# Patient Record
Sex: Female | Born: 1945 | Race: White | Hispanic: No | Smoking: Never smoker
Health system: Southern US, Community
[De-identification: ages and names within clinical notes are randomized; demographics above are authoritative.]

## PROBLEM LIST (undated history)

## (undated) DIAGNOSIS — E079 Disorder of thyroid, unspecified: Secondary | ICD-10-CM

## (undated) DIAGNOSIS — J45909 Unspecified asthma, uncomplicated: Secondary | ICD-10-CM

## (undated) DIAGNOSIS — M1990 Unspecified osteoarthritis, unspecified site: Secondary | ICD-10-CM

## (undated) HISTORY — DX: Unspecified asthma, uncomplicated: J45.909

## (undated) HISTORY — DX: Unspecified osteoarthritis, unspecified site: M19.90

## (undated) HISTORY — PX: REPLACEMENT TOTAL KNEE: SUR1224

## (undated) HISTORY — PX: APPENDECTOMY: SHX54

## (undated) HISTORY — DX: Disorder of thyroid, unspecified: E07.9

---

## 2020-05-06 DIAGNOSIS — Z2821 Immunization not carried out because of patient refusal: Secondary | ICD-10-CM | POA: Insufficient documentation

## 2020-05-06 DIAGNOSIS — E039 Hypothyroidism, unspecified: Secondary | ICD-10-CM | POA: Insufficient documentation

## 2020-05-06 DIAGNOSIS — F411 Generalized anxiety disorder: Secondary | ICD-10-CM | POA: Insufficient documentation

## 2020-05-06 DIAGNOSIS — Z79899 Other long term (current) drug therapy: Secondary | ICD-10-CM | POA: Insufficient documentation

## 2020-05-25 ENCOUNTER — Other Ambulatory Visit: Payer: Self-pay

## 2020-05-25 ENCOUNTER — Encounter: Payer: Self-pay | Admitting: Sports Medicine

## 2020-05-25 ENCOUNTER — Ambulatory Visit (INDEPENDENT_AMBULATORY_CARE_PROVIDER_SITE_OTHER): Payer: Federal, State, Local not specified - PPO

## 2020-05-25 ENCOUNTER — Ambulatory Visit: Payer: Federal, State, Local not specified - PPO | Admitting: Sports Medicine

## 2020-05-25 DIAGNOSIS — M25561 Pain in right knee: Secondary | ICD-10-CM | POA: Diagnosis not present

## 2020-05-25 DIAGNOSIS — Z96651 Presence of right artificial knee joint: Secondary | ICD-10-CM

## 2020-05-25 DIAGNOSIS — G8929 Other chronic pain: Secondary | ICD-10-CM

## 2020-05-25 MED ORDER — TRAMADOL HCL 50 MG PO TABS
50.0000 mg | ORAL_TABLET | Freq: Three times a day (TID) | ORAL | 0 refills | Status: DC | PRN
Start: 2020-05-25 — End: 2021-03-02

## 2020-05-25 NOTE — Progress Notes (Addendum)
    Procedures performed today:    None.  Independent interpretation of notes and tests performed by another provider:   None.  Brief History, Exam, Impression, and Recommendations:    History of total knee arthroplasty, right This is a pleasant 75 year old female, she has a history of a right total knee arthroplasty done about 10 years ago, she is having some persistent pain No fevers, chills, moderate swelling. She historically was on tramadol with good relief and good function. There is some differing opinions between her and her previous physician and so she was no longer prescribe tramadol. I did review her controlled substance database, nothing concerning was seen. I am happy to take over prescriptions of tramadol, we are going to get some x-rays of her right knee to look for evidence of loosening of the prosthesis, if negative we will consider three-phase bone scan. I should see her approximately twice a year. She also plans to establish care with one of the other providers here for primary care.  X-rays overall unremarkable, ordering three-phase bone scan.    ___________________________________________ Ihor Austin. Benjamin Stain, M.D., ABFM., CAQSM. Primary Care and Sports Medicine [facility]  Adjunct Instructor of Family Medicine  [employer]

## 2020-05-25 NOTE — Assessment & Plan Note (Addendum)
This is a pleasant 75 year old female, she has a history of a right total knee arthroplasty done about 10 years ago, she is having some persistent pain No fevers, chills, moderate swelling. She historically was on tramadol with good relief and good function. There is some differing opinions between her and her previous physician and so she was no longer prescribe tramadol. I did review her controlled substance database, nothing concerning was seen. I am happy to take over prescriptions of tramadol, we are going to get some x-rays of her right knee to look for evidence of loosening of the prosthesis, if negative we will consider three-phase bone scan. I should see her approximately twice a year. She also plans to establish care with one of the other providers here for primary care.  X-rays overall unremarkable, ordering three-phase bone scan.

## 2020-05-26 NOTE — Addendum Note (Signed)
Addended by: Monica Becton on: 05/26/2020 04:55 PM   Modules accepted: Orders

## 2020-06-01 ENCOUNTER — Encounter (HOSPITAL_COMMUNITY): Payer: Federal, State, Local not specified - PPO

## 2020-07-05 ENCOUNTER — Ambulatory Visit: Payer: Federal, State, Local not specified - PPO | Admitting: Family Medicine

## 2020-07-08 ENCOUNTER — Other Ambulatory Visit: Payer: Self-pay

## 2020-07-08 ENCOUNTER — Ambulatory Visit: Payer: Federal, State, Local not specified - PPO | Admitting: Family Medicine

## 2020-07-08 ENCOUNTER — Encounter: Payer: Self-pay | Admitting: Family Medicine

## 2020-07-08 DIAGNOSIS — E039 Hypothyroidism, unspecified: Secondary | ICD-10-CM

## 2020-07-08 DIAGNOSIS — G4700 Insomnia, unspecified: Secondary | ICD-10-CM | POA: Insufficient documentation

## 2020-07-08 DIAGNOSIS — Z96651 Presence of right artificial knee joint: Secondary | ICD-10-CM

## 2020-07-08 DIAGNOSIS — F5104 Psychophysiologic insomnia: Secondary | ICD-10-CM | POA: Diagnosis not present

## 2020-07-08 MED ORDER — LEVOTHYROXINE SODIUM 88 MCG PO TABS: ORAL_TABLET | ORAL | 1 refills | Status: DC

## 2020-07-08 MED ORDER — ALPRAZOLAM 2 MG PO TABS
1.0000 mg | ORAL_TABLET | Freq: Every evening | ORAL | 1 refills | Status: DC | PRN
Start: 2020-07-08 — End: 2020-09-10

## 2020-07-08 NOTE — Progress Notes (Signed)
Cheryl Rogers - 75 y.o. female MRN [id number]  Date of birth: 1945/10/05  Subjective Chief Complaint  Patient presents with  . Establish Care    HPI Cheryl Rogers is a 75 y.o. female here today for initial visit to establish care.  She feels like she has been in pretty good health.  She recently moved here from [place].  She does have history of hypothyroidism, chronic knee pain, and insomnia.   Knee pain is managed with tramadol and she is seeing Dr. Benjamin Stain for this.    She feels good with current dose of levothyroxine.  Labs completed in March at previous PCP.   She is taking 1-2mg  of alprazolam at bedtime.  She has been on this for several months after trying multiple other medication for sleep.  She recalls trying melatonin, valerian root, tylenol pm,  ambien, sonata, and restoril. She thinks there may be a few more she has taken as well.  She would be open to trying other options if available.   ROS:  A comprehensive ROS was completed and negative except as noted per HPI  Allergies  Allergen Reactions  . Iodine Rash    Past Medical History:  Diagnosis Date  . Arthritis   . Asthma   . Thyroid disease     Past Surgical History:  Procedure Laterality Date  . APPENDECTOMY    . REPLACEMENT TOTAL KNEE Right     Social History   Socioeconomic History  . Marital status: Divorced    Spouse name: Not on file  . Number of children: 1  . Years of education: Not on file  . Highest education level: Not on file  Occupational History  . Occupation: Retired  Tobacco Use  . Smoking status: Never Smoker  . Smokeless tobacco: Never Used  Vaping Use  . Vaping Use: Never used  Substance and Sexual Activity  . Alcohol use: Not Currently  . Drug use: Never  . Sexual activity: Not Currently    Partners: Male  Other Topics Concern  . Not on file  Social History Narrative  . Not on file   Social Determinants of Health   Financial Resource Strain: Not on file  Food  Insecurity: Not on file  Transportation Needs: Not on file  Physical Activity: Not on file  Stress: Not on file  Social Connections: Not on file    History reviewed. No pertinent family history.  Health Maintenance  Topic Date Due  . COVID-19 Vaccine (1) Never done  . Hepatitis C Screening  Never done  . TETANUS/TDAP  Never done  . COLONOSCOPY (Pts 45-62yrs Insurance coverage will need to be confirmed)  Never done  . DEXA SCAN  Never done  . PNA vac Low Risk Adult (1 of 2 - PCV13) Never done  . INFLUENZA VACCINE  09/27/2020  . HPV VACCINES  Aged Out     ----------------------------------------------------------------------------------------------------------------------------------------------------------------------------------------------------------------- Physical Exam BP (!) 128/50 (BP Location: Left Arm, Patient Position: Sitting, Cuff Size: Normal)   Pulse 72   Temp 98.2 F (36.8 C)   Ht 5\' 2"  (1.575 m)   Wt 128 lb 4.8 oz (58.2 kg)   SpO2 100%   BMI 23.47 kg/m   Physical Exam Constitutional:      Appearance: Normal appearance.  HENT:     Head: Normocephalic and atraumatic.  Eyes:     General: No scleral icterus. Cardiovascular:     Rate and Rhythm: Normal rate and regular rhythm.  Pulmonary:  Effort: Pulmonary effort is normal.     Breath sounds: Normal breath sounds.  Musculoskeletal:     Cervical back: Neck supple.  Skin:    General: Skin is warm and dry.  Neurological:     General: No focal deficit present.     Mental Status: She is alert.  Psychiatric:        Mood and Affect: Mood normal.        Behavior: Behavior normal.     ------------------------------------------------------------------------------------------------------------------------------------------------------------------------------------------------------------------- Assessment and Plan  Hypothyroidism (acquired) Previous labs reviewed through Care Everywhere.   Continue  levothyroxine at current strength.   History of total knee arthroplasty, right She is having persistent pain which she is currently seeing Dr. Benjamin Stain for.  She has upcoming bone scan for further evaluation.  Pain managed with tramadol for now.   Insomnia She has tried numerous medications to help with sleep.  Alprazolam has worked best for her.  Will continue for now but would like to wean down on strength.  Will also request records to review what she has tried previously.    Meds ordered this encounter  Medications  . alprazolam (XANAX) 2 MG tablet    Sig: Take 0.5-1 tablets (1-2 mg total) by mouth at bedtime as needed.    Dispense:  30 tablet    Refill:  1  . levothyroxine (SYNTHROID) 88 MCG tablet    Sig: TAKE 1 TABLET BY MOUTH EVERY DAY IN THE MORNING ON AN EMPTY STOMACH    Dispense:  90 tablet    Refill:  1    Return in about 3 months (around 10/08/2020) for Hypothyroid/Insomnia.    This visit occurred during the SARS-CoV-2 public health emergency.  Safety protocols were in place, including screening questions prior to the visit, additional usage of staff PPE, and extensive cleaning of exam room while observing appropriate contact time as indicated for disinfecting solutions.

## 2020-07-08 NOTE — Assessment & Plan Note (Signed)
She is having persistent pain which she is currently seeing Dr. Benjamin Stain for.  She has upcoming bone scan for further evaluation.  Pain managed with tramadol for now.

## 2020-07-08 NOTE — Assessment & Plan Note (Signed)
Previous labs reviewed through Care Everywhere.   Continue levothyroxine at current strength.

## 2020-07-08 NOTE — Assessment & Plan Note (Signed)
She has tried numerous medications to help with sleep.  Alprazolam has worked best for her.  Will continue for now but would like to wean down on strength.  Will also request records to review what she has tried previously.

## 2020-07-08 NOTE — Patient Instructions (Addendum)
Great to meet you today! Continue current medications.  See me again in 3 months.

## 2020-07-13 ENCOUNTER — Encounter: Payer: Self-pay | Admitting: Family Medicine

## 2020-07-26 ENCOUNTER — Other Ambulatory Visit: Payer: Self-pay

## 2020-07-26 ENCOUNTER — Encounter: Payer: Self-pay | Admitting: Emergency Medicine

## 2020-07-26 ENCOUNTER — Emergency Department
Admission: EM | Admit: 2020-07-26 | Discharge: 2020-07-26 | Disposition: A | Discharge status: Home / Self Care | Payer: Federal, State, Local not specified - PPO | Source: Home / Self Care | Attending: Family Medicine | Admitting: Family Medicine

## 2020-07-26 DIAGNOSIS — L247 Irritant contact dermatitis due to plants, except food: Secondary | ICD-10-CM | POA: Diagnosis not present

## 2020-07-26 MED ORDER — METHYLPREDNISOLONE ACETATE 80 MG/ML IJ SUSP
80.0000 mg | Freq: Once | INTRAMUSCULAR | Status: AC
  Administered 2020-07-26: 80 mg via INTRAMUSCULAR

## 2020-07-26 MED ORDER — DOXYCYCLINE HYCLATE 100 MG PO CAPS: ORAL_CAPSULE | ORAL | 0 refills | Status: DC

## 2020-07-26 NOTE — ED Provider Notes (Signed)
[facility]    CSN: [id number] Arrival date & time: 07/26/20  1532      History   Chief Complaint Chief Complaint  Patient presents with  . Rash    HPI Cheryl Rogers is a 75 y.o. female.   While cutting brush on a newly acquired piece of property two weeks ago, patient believes that she contacted poison oak.  She has had a persistent pruritic rash on her sun-exposed arms and upper legs (she was wearing high boots that covered her legs below the knees).  She recalls that she initially developed tiny blisters containing clear fluid.   The history is provided by the patient.  Rash Location: forearms and upper legs. Quality: blistering, itchiness and redness   Quality: not draining and not weeping   Severity:  Moderate Duration:  2 weeks Timing:  Constant Progression:  Worsening Chronicity:  New Context: plant contact   Relieved by:  Nothing Worsened by:  Heat Ineffective treatments:  Anti-itch cream Associated symptoms: no fatigue, no fever, no induration and no joint pain     Past Medical History:  Diagnosis Date  . Arthritis   . Asthma   . Thyroid disease     Patient Active Problem List   Diagnosis Date Noted  . Insomnia 07/08/2020  . History of total knee arthroplasty, right 05/25/2020  . Immunization refused 05/06/2020  . Hypothyroidism (acquired) 05/06/2020  . GAD (generalized anxiety disorder) 05/06/2020  . Controlled substance agreement signed 05/06/2020    Past Surgical History:  Procedure Laterality Date  . APPENDECTOMY    . REPLACEMENT TOTAL KNEE Right     OB History   No obstetric history on file.      Home Medications    Prior to Admission medications   Medication Sig Start Date End Date Taking? Authorizing Provider  doxycycline (VIBRAMYCIN) 100 MG capsule Take one cap PO Q12hr with food. 07/26/20  Yes Lattie Haw, MD  levothyroxine (SYNTHROID) 88 MCG tablet TAKE 1 TABLET BY MOUTH EVERY DAY IN THE MORNING ON AN EMPTY  STOMACH 07/08/20  Yes Everrett Coombe, DO  traMADol (ULTRAM) 50 MG tablet Take 1-2 tablets (50-100 mg total) by mouth every 8 (eight) hours as needed for moderate pain. Maximum 6 tabs per day. 05/25/20  Yes Monica Becton, MD  alprazolam Prudy Feeler) 2 MG tablet Take 0.5-1 tablets (1-2 mg total) by mouth at bedtime as needed. 07/08/20   Everrett Coombe, DO    Family History Family History  Problem Relation Age of Onset  . Healthy Mother   . Healthy Father     Social History Social History   Tobacco Use  . Smoking status: Never Smoker  . Smokeless tobacco: Never Used  Vaping Use  . Vaping Use: Never used  Substance Use Topics  . Alcohol use: Not Currently  . Drug use: Never     Allergies   Iodine   Review of Systems Review of Systems  Constitutional: Negative for activity change, chills, diaphoresis, fatigue and fever.  Musculoskeletal: Negative for arthralgias.  Skin: Positive for color change and rash. Negative for wound.  All other systems reviewed and are negative.    Physical Exam Triage Vital Signs ED Triage Vitals  Enc Vitals Group     BP 07/26/20 1727 (!) 145/79     Pulse Rate 07/26/20 1727 67     Resp 07/26/20 1727 17     Temp 07/26/20 1727 98.4 F (36.9 C)     Temp Source 07/26/20  1727 Oral     SpO2 07/26/20 1727 100 %     Weight 07/26/20 1729 128 lb 4.8 oz (58.2 kg)     Height 07/26/20 1729 5\' 2"  (1.575 m)     Head Circumference --      Peak Flow --      Pain Score 07/26/20 1728 4     Pain Loc --      Pain Edu? --      Excl. in GC? --    No data found.  Updated Vital Signs BP (!) 145/79 (BP Location: Right Arm)   Pulse 67   Temp 98.4 F (36.9 C) (Oral)   Resp 17   Ht 5\' 2"  (1.575 m)   Wt 58.2 kg   SpO2 100%   BMI 23.47 kg/m   Visual Acuity Right Eye Distance:   Left Eye Distance:   Bilateral Distance:    Right Eye Near:   Left Eye Near:    Bilateral Near:     Physical Exam Vitals and nursing note reviewed.  Constitutional:       General: She is not in acute distress. HENT:     Head: Normocephalic.     Mouth/Throat:     Mouth: Mucous membranes are moist.  Eyes:     Pupils: Pupils are equal, round, and reactive to light.  Cardiovascular:     Rate and Rhythm: Normal rate.  Pulmonary:     Effort: Pulmonary effort is normal.  Skin:    General: Skin is warm and dry.     Findings: Rash present.          Comments: Exposed areas of arms and upper legs have numerous small 56mm to 40mm erythematous macules, some with central eschar.  No tenderness to palpation.  Left antecubital area has confluent erythema.  Neurological:     Mental Status: She is alert and oriented to person, place, and time.      UC Treatments / Results  Labs (all labs ordered are listed, but only abnormal results are displayed) Labs Reviewed - No data to display  EKG   Radiology No results found.  Procedures Procedures (including critical care time)  Medications Ordered in UC Medications  methylPREDNISolone acetate (DEPO-MEDROL) injection 80 mg (has no administration in time range)    Initial Impression / Assessment and Plan / UC Course  I have reviewed the triage vital signs and the nursing notes.  Pertinent labs & imaging results that were available during my care of the patient were reviewed by me and considered in my medical decision making (see chart for details).    Suspect likely heat rash (miliara crystallina) vs contact dermatitis, with secondary cellulitis. Begin doxycycline. Administered Depo Medrol 80mg  IM Followup with Family Doctor if not improved in one week.    Final Clinical Impressions(s) / UC Diagnoses   Final diagnoses:  Irritant contact dermatitis due to plants, except food     Discharge Instructions     Minimize baths/showers until improved.  Use a mild bath soap containing oil such as unscented Dove.  Apply a moisturizing cream or lotion immediately after bathing while still wet, then towel dry.      ED Prescriptions    Medication Sig Dispense Auth. Provider   doxycycline (VIBRAMYCIN) 100 MG capsule Take one cap PO Q12hr with food. 14 capsule 1m, MD        4m, MD 07/27/20 [phone]

## 2020-07-26 NOTE — ED Triage Notes (Signed)
Rash x 2 weeks Pt thinks she got poison oak while clearing brush at her new house OTC - acetone, rubbing alcohol, calamine, Tecnu (off the internet)  No COVID vaccine

## 2020-07-26 NOTE — Discharge Instructions (Addendum)
Minimize baths/showers until improved.  Use a mild bath soap containing oil such as unscented Dove.  Apply a moisturizing cream or lotion immediately after bathing while still wet, then towel dry.

## 2020-07-28 ENCOUNTER — Telehealth: Payer: Self-pay | Admitting: *Deleted

## 2020-07-28 NOTE — Telephone Encounter (Signed)
Looks like she was given injection of depo-medrol.  It may take a day or two for this to start working well for her.  She can try benadryl initially and cool compresses.

## 2020-07-28 NOTE — Telephone Encounter (Signed)
Pt notified of provider recommendations. 

## 2020-07-28 NOTE — Telephone Encounter (Signed)
Pt left vm right at 5pm yesterday stating that she was in UC for poison oak and was given an antibiotic but is still having pretty bad itching.  She wanted to know if there is anything you could give her for the itching.  Please advise.

## 2020-09-10 ENCOUNTER — Ambulatory Visit: Payer: Federal, State, Local not specified - PPO | Admitting: Family Medicine

## 2020-09-10 ENCOUNTER — Encounter: Payer: Self-pay | Admitting: Family Medicine

## 2020-09-10 ENCOUNTER — Other Ambulatory Visit: Payer: Self-pay

## 2020-09-10 VITALS — BP 152/67 | HR 78 | Ht 62.0 in | Wt 125.0 lb

## 2020-09-10 DIAGNOSIS — E039 Hypothyroidism, unspecified: Secondary | ICD-10-CM | POA: Diagnosis not present

## 2020-09-10 DIAGNOSIS — F5104 Psychophysiologic insomnia: Secondary | ICD-10-CM | POA: Diagnosis not present

## 2020-09-10 MED ORDER — ALPRAZOLAM 2 MG PO TABS: 1.0000 mg | ORAL_TABLET | Freq: Every evening | ORAL | 1 refills | Status: DC | PRN

## 2020-09-11 LAB — TSH: TSH: 2.16 mIU/L (ref 0.40–4.50)

## 2020-09-11 NOTE — Assessment & Plan Note (Signed)
She continues to do well with alprazolam 1 to 2 mg at bedtime as needed.  I will continue these for now however discussed with her that ideally I would like to wean her back to a lower dose of this.

## 2020-09-11 NOTE — Assessment & Plan Note (Signed)
She feels good at current dose of levothyroxine.  We will update TSH today.

## 2020-09-11 NOTE — Progress Notes (Signed)
Cheryl Rogers - 75 y.o. female MRN [id number]  Date of birth: [birth year]  Subjective Chief Complaint  Patient presents with   Allergic Rhinitis     HPI Cheryl Rogers is a 75 year old female here today for a follow-up visit.  She has follow-up today for hypothyroidism and insomnia.  She states she is doing well however has had some issues with allergies recently.  This was worse after mowing her grass yesterday.  She is taking antihistamines and decongestants to help with this.  Her blood pressure is elevated today however she denies any symptoms related to this.  Her previous TSH was around 12 but her other PCPs office.  She reports she has been taking levothyroxine daily.  She has not noted any symptoms related to hypo or hyperthyroidism.  She continues to take alprazolam 1 to 2 mg at bedtime as needed for sleep.  This continues to work well for her without significant side effects.  She does not use this every night but does use most nights.  She has tried multiple medications in the past which are reflected in her previous medical records as well as my last note.  ROS:  A comprehensive ROS was completed and negative except as noted per HPI  Allergies  Allergen Reactions   Iodine Rash    Past Medical History:  Diagnosis Date   Arthritis    Asthma    Thyroid disease     Past Surgical History:  Procedure Laterality Date   APPENDECTOMY     REPLACEMENT TOTAL KNEE Right     Social History   Socioeconomic History   Marital status: Divorced    Spouse name: Not on file   Number of children: 1   Years of education: Not on file   Highest education level: Not on file  Occupational History   Occupation: Retired  Tobacco Use   Smoking status: Never   Smokeless tobacco: Never  Vaping Use   Vaping Use: Never used  Substance and Sexual Activity   Alcohol use: Not Currently   Drug use: Never   Sexual activity: Not Currently    Partners: Male  Other Topics Concern   Not on file   Social History Narrative   Not on file   Social Determinants of Health   Financial Resource Strain: Not on file  Food Insecurity: Not on file  Transportation Needs: Not on file  Physical Activity: Not on file  Stress: Not on file  Social Connections: Not on file    Family History  Problem Relation Age of Onset   Healthy Mother    Healthy Father     Health Maintenance  Topic Date Due   COVID-19 Vaccine (1) Never done   Hepatitis C Screening  Never done   TETANUS/TDAP  Never done   COLONOSCOPY (Pts 45-77yrs Insurance coverage will need to be confirmed)  Never done   DEXA SCAN  Never done   PNA vac Low Risk Adult (1 of 2 - PCV13) Never done   Zoster Vaccines- Shingrix (2 of 2) 04/24/2012   INFLUENZA VACCINE  09/27/2020   HPV VACCINES  Aged Out     ----------------------------------------------------------------------------------------------------------------------------------------------------------------------------------------------------------------- Physical Exam BP (!) 152/67 (BP Location: Left Arm, Patient Position: Sitting, Cuff Size: Normal)   Pulse 78   Ht 5\' 2"  (1.575 m)   Wt 125 lb (56.7 kg)   SpO2 98%   BMI 22.86 kg/m   Physical Exam Constitutional:      Appearance: Normal appearance.  Eyes:  General: No scleral icterus. Cardiovascular:     Rate and Rhythm: Normal rate and regular rhythm.  Pulmonary:     Effort: Pulmonary effort is normal.     Breath sounds: Normal breath sounds.  Musculoskeletal:     Cervical back: Neck supple.  Neurological:     General: No focal deficit present.     Mental Status: She is alert.  Psychiatric:        Mood and Affect: Mood normal.        Behavior: Behavior normal.    ------------------------------------------------------------------------------------------------------------------------------------------------------------------------------------------------------------------- Assessment and  Plan  Insomnia She continues to do well with alprazolam 1 to 2 mg at bedtime as needed.  I will continue these for now however discussed with her that ideally I would like to wean her back to a lower dose of this.  Hypothyroidism (acquired) She feels good at current dose of levothyroxine.  We will update TSH today.   Meds ordered this encounter  Medications   alprazolam (XANAX) 2 MG tablet    Sig: Take 0.5-1 tablets (1-2 mg total) by mouth at bedtime as needed.    Dispense:  90 tablet    Refill:  1    Return in about 6 months (around 03/13/2021) for insomnia/hypothyroidism.    This visit occurred during the SARS-CoV-2 public health emergency.  Safety protocols were in place, including screening questions prior to the visit, additional usage of staff PPE, and extensive cleaning of exam room while observing appropriate contact time as indicated for disinfecting solutions.

## 2020-09-15 ENCOUNTER — Ambulatory Visit: Payer: Federal, State, Local not specified - PPO | Admitting: Family Medicine

## 2020-10-07 ENCOUNTER — Ambulatory Visit: Payer: Federal, State, Local not specified - PPO | Admitting: Family Medicine

## 2020-10-26 ENCOUNTER — Other Ambulatory Visit: Payer: Self-pay

## 2020-10-26 ENCOUNTER — Ambulatory Visit (INDEPENDENT_AMBULATORY_CARE_PROVIDER_SITE_OTHER): Payer: Federal, State, Local not specified - PPO

## 2020-10-26 ENCOUNTER — Ambulatory Visit: Payer: Federal, State, Local not specified - PPO | Admitting: Sports Medicine

## 2020-10-26 DIAGNOSIS — M545 Low back pain, unspecified: Secondary | ICD-10-CM | POA: Diagnosis not present

## 2020-10-26 DIAGNOSIS — M25551 Pain in right hip: Secondary | ICD-10-CM | POA: Diagnosis not present

## 2020-10-26 DIAGNOSIS — G8929 Other chronic pain: Secondary | ICD-10-CM

## 2020-10-26 MED ORDER — MELOXICAM 15 MG PO TABS: ORAL_TABLET | ORAL | 3 refills | Status: DC

## 2020-10-26 NOTE — Progress Notes (Addendum)
    Procedures performed today:    None.  Independent interpretation of notes and tests performed by another provider:   None.  Brief History, Exam, Impression, and Recommendations:    Chronic right hip pain Cheryl Rogers is a very pleasant 75 year old female, she has pain in her right proximal lateral hip, worse with twisting motions, she also has significant back pain. On exam she has no tenderness over the greater trochanter, she has no pain with internal rotation, negative FADIR sign. No pain to palpation over the lumbar spine. I suspect her hip pain is referred from her lumbar spine, we discussed core conditioning. I am going to add some lumbar spine and hip conditioning exercises, meloxicam, x-rays of the lumbar spine and the hip. Return to see me in 4 to 6 weeks, advanced imaging if no better.    ___________________________________________ Ihor Austin. Benjamin Stain, M.D., ABFM., CAQSM. Primary Care and Sports Medicine [facility]  Adjunct Instructor of Family Medicine  [employer]

## 2020-10-26 NOTE — Assessment & Plan Note (Addendum)
Cheryl Rogers is a very pleasant 75 year old female, she has pain in her right proximal lateral hip, worse with twisting motions, she also has significant back pain. On exam she has no tenderness over the greater trochanter, she has no pain with internal rotation, negative FADIR sign. No pain to palpation over the lumbar spine. I suspect her hip pain is referred from her lumbar spine, we discussed core conditioning. I am going to add some lumbar spine and hip conditioning exercises, meloxicam, x-rays of the lumbar spine and the hip. Return to see me in 4 to 6 weeks, advanced imaging if no better.

## 2020-11-26 ENCOUNTER — Ambulatory Visit: Payer: Federal, State, Local not specified - PPO | Admitting: Sports Medicine

## 2021-01-31 ENCOUNTER — Other Ambulatory Visit: Payer: Self-pay | Admitting: Family Medicine

## 2021-02-02 ENCOUNTER — Other Ambulatory Visit: Payer: Self-pay

## 2021-02-02 ENCOUNTER — Ambulatory Visit (INDEPENDENT_AMBULATORY_CARE_PROVIDER_SITE_OTHER): Payer: Federal, State, Local not specified - PPO

## 2021-02-02 ENCOUNTER — Ambulatory Visit: Payer: Federal, State, Local not specified - PPO | Admitting: Sports Medicine

## 2021-02-02 DIAGNOSIS — Z09 Encounter for follow-up examination after completed treatment for conditions other than malignant neoplasm: Secondary | ICD-10-CM

## 2021-02-02 DIAGNOSIS — M1712 Unilateral primary osteoarthritis, left knee: Secondary | ICD-10-CM

## 2021-02-02 DIAGNOSIS — G8929 Other chronic pain: Secondary | ICD-10-CM | POA: Insufficient documentation

## 2021-02-02 MED ORDER — CELECOXIB 200 MG PO CAPS: ORAL_CAPSULE | ORAL | 2 refills | Status: DC

## 2021-02-02 NOTE — Progress Notes (Signed)
    Procedures performed today:    None.  Independent interpretation of notes and tests performed by another provider:   None.  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 She is also post right knee arthroplasty. She is starting to have pain in the left knee, posterior/medial joint line, moderate gelling, unable to flex past 90 degrees. We will start conservatively, Celebrex, x-rays, formal physical therapy, return to see me in 4 weeks, injection if no better.   ___________________________________________ Ihor Austin. Benjamin Stain, M.D., ABFM., CAQSM. Primary Care and Sports Medicine [facility]  Adjunct Instructor of Family Medicine  [employer]

## 2021-02-02 NOTE — Assessment & Plan Note (Signed)
This is a pleasant

## 2021-02-02 NOTE — Progress Notes (Signed)
j

## 2021-02-02 NOTE — Assessment & Plan Note (Signed)
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 She is also post right knee arthroplasty. She is starting to have pain in the left knee, posterior/medial joint line, moderate gelling, unable to flex past 90 degrees. We will start conservatively, Celebrex, x-rays, formal physical therapy, return to see me in 4 weeks, injection if no better.

## 2021-02-09 ENCOUNTER — Encounter: Payer: Self-pay | Admitting: Physical Therapy

## 2021-02-09 ENCOUNTER — Ambulatory Visit (INDEPENDENT_AMBULATORY_CARE_PROVIDER_SITE_OTHER): Payer: Federal, State, Local not specified - PPO | Admitting: Physical Therapy

## 2021-02-09 ENCOUNTER — Other Ambulatory Visit: Payer: Self-pay

## 2021-02-09 DIAGNOSIS — R29898 Other symptoms and signs involving the musculoskeletal system: Secondary | ICD-10-CM

## 2021-02-09 DIAGNOSIS — R262 Difficulty in walking, not elsewhere classified: Secondary | ICD-10-CM | POA: Diagnosis not present

## 2021-02-09 DIAGNOSIS — M25562 Pain in left knee: Secondary | ICD-10-CM | POA: Diagnosis not present

## 2021-02-09 NOTE — Patient Instructions (Signed)
Access Code: [email or web address] URL: [email or web address] Date: 02/09/2021 Prepared by: [employer]  Exercises Supine Quad Set - 1 x daily - 7 x weekly - 1 sets - 10 reps - 3-5 seconds hold Small Range Straight Leg Raise - 1 x daily - 7 x weekly - 3 sets - 10 reps Straight Leg Raise with External Rotation - 1 x daily - 7 x weekly - 3 sets - 10 reps Sidelying Hip Abduction - 1 x daily - 7 x weekly - 3 sets - 10 reps Prone Hip Extension - 1 x daily - 7 x weekly - 3 sets - 10 reps  Patient Education [place] Patient Instructions

## 2021-02-09 NOTE — Therapy (Signed)
[facility] [place] [address] [place], [place], [address] Phone: [phone]   Fax:  [phone]  Physical Therapy Evaluation  Patient Details  Name: Cheryl Rogers MRN: [id number] Date of Birth: [birth year] Referring Provider (PT): Thekkekandam   Encounter Date: 02/09/2021   PT End of Session - 02/09/21 1342     Visit Number 1    Number of Visits 12    Date for PT Re-Evaluation 03/23/21    PT Start Time 1100    PT Stop Time 1145    PT Time Calculation (min) 45 min    Activity Tolerance Patient tolerated treatment well    Behavior During Therapy [facility] for tasks assessed/performed             Past Medical History:  Diagnosis Date   Arthritis    Asthma    Thyroid disease     Past Surgical History:  Procedure Laterality Date   APPENDECTOMY     REPLACEMENT TOTAL KNEE Right     There were no vitals filed for this visit.    Subjective Assessment - 02/09/21 1107     Subjective Pt states she has 2 acres of land she has been mowing and working on and has  had recent onset of Lt knee pain in the past 3 months. Pt states pain increases with knee flexion so she has difficulty standing from low surfaces and putting on pants. Pain decreases with seated rest. Pain occasionally travels up the back of her thigh in a deep achey pain. She saw MD who diagonsed her with arthritis and bone spurs and recommends PT.    Pertinent History Rt knee replacement    How long can you walk comfortably? 30-40 minutes    Diagnostic tests x ray shows Lt knee OA    Patient Stated Goals decrease pain and be able to work in yard without pain    Currently in Pain? Yes    Pain Score 6     Pain Location Knee    Pain Orientation Left;Posterior;Lateral    Pain Descriptors / Indicators Aching;Sharp    Pain Type Acute pain    Pain Onset More than a month ago    Pain Frequency Intermittent    Aggravating Factors  knee flexion    Pain Relieving  Factors seated rest                [facility] PT Assessment - 02/09/21 0001       Assessment   Medical Diagnosis Lt knee OA    Referring Provider (PT) Thekkekandam    Onset Date/Surgical Date 11/25/20    Next MD Visit 4 weeks    Prior Therapy after Rt knee replacement      Precautions   Precautions None      Restrictions   Weight Bearing Restrictions No      Balance Screen   Has the patient fallen in the past 6 months No      Home Environment   Additional Comments 2 steps to enter level home      Prior Function   Level of Independence Independent      Observation/Other Assessments   Focus on Therapeutic Outcomes (FOTO)  56      Observation/Other Assessments-Edema    Edema Circumferential      Circumferential Edema   Circumferential - Right 37cm    Circumferential - Left  37.5 cm      ROM / Strength   AROM / PROM /  Strength AROM;Strength      AROM   AROM Assessment Site Knee    Right/Left Knee Left;Right    Right Knee Extension 0    Right Knee Flexion 121    Left Knee Extension -4    Left Knee Flexion 115   pain     Strength   Overall Strength Comments Lt hip ext 4-/5, Lt hip abd 4-/5 (pain), Lt hip flex 4+/5   pain in medial Lt knee with resisted hip abduction   Strength Assessment Site Knee    Right/Left Knee Right;Left    Right Knee Flexion 4+/5    Right Knee Extension 4+/5    Left Knee Flexion 4+/5    Left Knee Extension 4+/5      Palpation   Palpation comment TTP pes anserine area, hamstring insertion posterior Lt knee                        Objective measurements completed on examination: See above findings.       [facility] Adult PT Treatment/Exercise - 02/09/21 0001       Exercises   Exercises Knee/Hip      Knee/Hip Exercises: Supine   Quad Sets 5 reps    Straight Leg Raises 5 reps    Straight Leg Raise with External Rotation 5 reps      Knee/Hip Exercises: Sidelying   Hip ABduction 5 reps      Knee/Hip Exercises:  Prone   Hip Extension 5 reps      Modalities   Modalities Iontophoresis      Iontophoresis   Type of Iontophoresis Dexamethasone    Location Lt knee    Dose 45ml    Time 8 hour patch                     PT Education - 02/09/21 1138     Education Details PT POC and goals, HEP, ionto    Person(s) Educated Patient    Methods Explanation;Demonstration;Handout    Comprehension Returned demonstration;Verbalized understanding                 PT Long Term Goals - 02/09/21 1551       PT LONG TERM GOAL #1   Title Pt will be independent with HEP    Time 6    Period Weeks    Status New    Target Date 03/23/21      PT LONG TERM GOAL #2   Title Pt will improve FOTO to >=70 to demo improved functional mobility    Time 6    Period Weeks    Status New    Target Date 03/23/21      PT LONG TERM GOAL #3   Title Pt will tolerate standing up from toilet seat with pain <= 1/10    Time 6    Period Weeks    Status New    Target Date 03/23/21      PT LONG TERM GOAL #4   Title Pt will climb onto stool and/or ladder with pain <= 2/10    Time 6    Period Weeks    Status New    Target Date 03/23/21                    Plan - 02/09/21 1343     Clinical Impression Statement Pt is a 75 y/o female referred for Lt knee OA. Pt presents with  symptoms consistent with Lt pes anserine bursitis with tenderness in Lt medial and posterior knee, pain with resisted hip abduction, decreased Lt hamstring strength and pain with Lt knee flexion. Pt will benefit from skilled PT to address deficits and improve functional mobility    Personal Factors and Comorbidities Comorbidity 2;Past/Current Experience;Behavior Pattern    Examination-Activity Limitations Transfers;Bend;Locomotion Level    Examination-Participation Restrictions Community Activity;Yard Work;Cleaning    Stability/Clinical Decision Making Evolving/Moderate complexity    Clinical Decision Making Moderate    Rehab  Potential Good    PT Frequency 2x / week    PT Duration 6 weeks    PT Treatment/Interventions Aquatic Therapy;Electrical Stimulation;Iontophoresis 4mg /ml Dexamethasone;Cryotherapy;Moist Heat;Stair training;Gait training;Therapeutic exercise;Balance training;Neuromuscular re-education;Patient/family education;Therapeutic activities;Manual techniques;Passive range of motion;Dry needling;Vasopneumatic Device;Taping    PT Next Visit Plan assess HEP, progress knee strength and ROM    PT Home Exercise Plan [phone]    Consulted and Agree with Plan of Care Patient             Patient will benefit from skilled therapeutic intervention in order to improve the following deficits and impairments:  Pain, Decreased strength, Decreased range of motion, Decreased activity tolerance, Decreased mobility, Difficulty walking, Increased muscle spasms, Impaired flexibility  Visit Diagnosis: Acute pain of left knee - Plan: PT plan of care cert/re-cert  Difficulty in walking, not elsewhere classified - Plan: PT plan of care cert/re-cert  Other symptoms and signs involving the musculoskeletal system - Plan: PT plan of care cert/re-cert     Problem List Patient Active Problem List   Diagnosis Date Noted   Primary osteoarthritis of left knee 02/02/2021   Chronic right hip pain 10/26/2020   Insomnia 07/08/2020   History of total knee arthroplasty, right 05/25/2020   Immunization refused 05/06/2020   Hypothyroidism (acquired) 05/06/2020   GAD (generalized anxiety disorder) 05/06/2020   Controlled substance agreement signed 05/06/2020    07/06/2020, PT 02/09/2021, 3:59 PM  [facility] [address] [place], [place], [place] Phone: [phone]   Fax:  [phone]  Name: Cheryl Rogers MRN: [name] Date of Birth: November 24, 1945

## 2021-02-15 ENCOUNTER — Encounter: Payer: Self-pay | Admitting: Physical Therapy

## 2021-02-15 ENCOUNTER — Other Ambulatory Visit: Payer: Self-pay

## 2021-02-15 ENCOUNTER — Ambulatory Visit: Payer: Federal, State, Local not specified - PPO | Admitting: Physical Therapy

## 2021-02-15 DIAGNOSIS — M25562 Pain in left knee: Secondary | ICD-10-CM

## 2021-02-15 DIAGNOSIS — R262 Difficulty in walking, not elsewhere classified: Secondary | ICD-10-CM

## 2021-02-15 DIAGNOSIS — R29898 Other symptoms and signs involving the musculoskeletal system: Secondary | ICD-10-CM | POA: Diagnosis not present

## 2021-02-15 NOTE — Therapy (Signed)
[facility] [place] [address] [place], [place], [address] Phone: [phone]   Fax:  [phone]  Physical Therapy Treatment  Patient Details  Name: Cheryl Rogers MRN: [id number] Date of Birth: [birth year] Referring Provider (PT): Thekkekandam   Encounter Date: 02/15/2021   PT End of Session - 02/15/21 1155     Visit Number 2    Number of Visits 12    Date for PT Re-Evaluation 03/23/21    PT Start Time 1151    PT Stop Time 1230    PT Time Calculation (min) 39 min    Activity Tolerance Patient tolerated treatment well    Behavior During Therapy [facility] for tasks assessed/performed             Past Medical History:  Diagnosis Date   Arthritis    Asthma    Thyroid disease     Past Surgical History:  Procedure Laterality Date   APPENDECTOMY     REPLACEMENT TOTAL KNEE Right     There were no vitals filed for this visit.   Subjective Assessment - 02/15/21 1155     Subjective Pt reports relief with ionto patch to Lt knee.  She reports improved walking tolerance at store, with less pain.    Patient Stated Goals decrease pain and be able to work in yard without pain    Currently in Pain? Yes    Pain Score 1     Pain Location Knee    Pain Orientation Left;Lateral    Pain Descriptors / Indicators Aching;Radiating    Aggravating Factors  lifting knee up to don pants    Pain Relieving Factors seated rest; recliner.                [facility] PT Assessment - 02/15/21 0001       Assessment   Medical Diagnosis Lt knee OA    Referring Provider (PT) Thekkekandam    Onset Date/Surgical Date 11/25/20    Prior Therapy after Rt knee replacement      Flexibility   Soft Tissue Assessment /Muscle Length yes    Quadriceps ~90 bilat              [facility] Adult PT Treatment/Exercise - 02/15/21 0001       Self-Care   Self-Care Other Self-Care Comments    Other Self-Care Comments  pt educated on self massage with  roller stick to LLE. pt returned demo with cues.      Knee/Hip Exercises: Stretches   Passive Hamstring Stretch Left;2 reps;20 seconds   supine with strap   Quad Stretch Left;2 reps;30 seconds;Right;1 rep   prone with strap   ITB Stretch Left;2 reps;20 seconds   supine with strap   Gastroc Stretch Right;Left;2 reps;20 seconds    Other Knee/Hip Stretches supine Lt adductor stretch with strap x 2 reps of 20 sec      Knee/Hip Exercises: Aerobic   Nustep L5: 5 min for warm up (arms/legs)      Knee/Hip Exercises: Seated   Sit to Sand without UE support   squat to touch table with buttocks x 2 reps     Knee/Hip Exercises: Supine   Quad Sets Left;1 set;5 reps   5 sec hold   Straight Leg Raises Strengthening;Left;1 set;10 reps    Straight Leg Raise with External Rotation Strengthening;Left;1 set;10 reps      Knee/Hip Exercises: Sidelying   Hip ABduction Strengthening;Left;2 sets;15 reps    Hip ABduction Limitations added pulses  on 2nd set      Knee/Hip Exercises: Prone   Hip Extension Left;1 set;10 reps   (knee flexed)   Straight Leg Raises Strengthening;Left;1 set;10 reps      Iontophoresis   Type of Iontophoresis Dexamethasone    Location Lt pes anserine    Dose 1.cc    Time 8 hr patch                PT Long Term Goals - 02/09/21 1551       PT LONG TERM GOAL #1   Title Pt will be independent with HEP    Time 6    Period Weeks    Status New    Target Date 03/23/21      PT LONG TERM GOAL #2   Title Pt will improve FOTO to >=70 to demo improved functional mobility    Time 6    Period Weeks    Status New    Target Date 03/23/21      PT LONG TERM GOAL #3   Title Pt will tolerate standing up from toilet seat with pain <= 1/10    Time 6    Period Weeks    Status New    Target Date 03/23/21      PT LONG TERM GOAL #4   Title Pt will climb onto stool and/or ladder with pain <= 2/10    Time 6    Period Weeks    Status New    Target Date 03/23/21                    Plan - 02/15/21 1248     Clinical Impression Statement Positive response to ionto patch and exercises, with pt reporting less pain overall.  Tight quads bilat; added stretch to HEP, along with adductor and ITB stretches.  Pt tolerated exercises well, without increase in pain.  Goals are ongoing.    Personal Factors and Comorbidities Comorbidity 2;Past/Current Experience;Behavior Pattern    Examination-Activity Limitations Transfers;Bend;Locomotion Level    Examination-Participation Restrictions Community Activity;Yard Work;Cleaning    Stability/Clinical Decision Making Evolving/Moderate complexity    Rehab Potential Good    PT Frequency 2x / week    PT Duration 6 weeks    PT Treatment/Interventions Aquatic Therapy;Electrical Stimulation;Iontophoresis 4mg /ml Dexamethasone;Cryotherapy;Moist Heat;Stair training;Gait training;Therapeutic exercise;Balance training;Neuromuscular re-education;Patient/family education;Therapeutic activities;Manual techniques;Passive range of motion;Dry needling;Vasopneumatic Device;Taping    PT Next Visit Plan progress knee strength and ROM    PT Home Exercise Plan    Consulted and Agree with Plan of Care Patient             Patient will benefit from skilled therapeutic intervention in order to improve the following deficits and impairments:  Pain, Decreased strength, Decreased range of motion, Decreased activity tolerance, Decreased mobility, Difficulty walking, Increased muscle spasms, Impaired flexibility  Visit Diagnosis: Acute pain of left knee  Difficulty in walking, not elsewhere classified  Other symptoms and signs involving the musculoskeletal system     Problem List Patient Active Problem List   Diagnosis Date Noted   Primary osteoarthritis of left knee 02/02/2021   Chronic right hip pain 10/26/2020   Insomnia 07/08/2020   History of total knee arthroplasty, right 05/25/2020   Immunization refused 05/06/2020    Hypothyroidism (acquired) 05/06/2020   GAD (generalized anxiety disorder) 05/06/2020   Controlled substance agreement signed 05/06/2020   07/06/2020, PTA 02/15/21 12:58 PM  [facility] [address]  [place], [place], [address] Phone: [phone]   Fax:  [phone]  Name: Cheryl Rogers MRN: [id number] Date of Birth: [birth year]

## 2021-02-24 ENCOUNTER — Ambulatory Visit: Payer: Federal, State, Local not specified - PPO | Admitting: Physical Therapy

## 2021-02-24 ENCOUNTER — Encounter: Payer: Self-pay | Admitting: Physical Therapy

## 2021-02-24 ENCOUNTER — Other Ambulatory Visit: Payer: Self-pay

## 2021-02-24 DIAGNOSIS — R29898 Other symptoms and signs involving the musculoskeletal system: Secondary | ICD-10-CM | POA: Diagnosis not present

## 2021-02-24 DIAGNOSIS — M25562 Pain in left knee: Secondary | ICD-10-CM

## 2021-02-24 DIAGNOSIS — R262 Difficulty in walking, not elsewhere classified: Secondary | ICD-10-CM | POA: Diagnosis not present

## 2021-02-24 NOTE — Patient Instructions (Addendum)
Kinesiology tape What is kinesiology tape?  There are many brands of kinesiology tape.  KTape, [employer], [employer], Dynamic tape, to name a few. It is an elasticized tape designed to support the bodys natural healing process. This tape provides stability and support to muscles and joints without restricting motion. It can also help decrease swelling in the area of application. How does it work? The tape microscopically lifts and decompresses the skin to allow for drainage of lymph (swelling) to flow away from area, reducing inflammation.  The tape has the ability to help re-educate the neuromuscular system by targeting specific receptors in the skin.  The presence of the tape increases the bodys awareness of posture and body mechanics.  Do not use with: Open wounds Skin lesions Adhesive allergies Safe removal of the tape: In some rare cases, mild/moderate skin irritation can occur.  This can include redness, itchiness, or hives. If this occurs, immediately remove tape and consult your primary care physician if symptoms are severe or do not resolve within 2 days.  To remove tape safely, hold nearby skin with one hand and gentle roll tape down with other hand.  You can apply oil or conditioner to tape while in shower prior to removal to loosen adhesive.  DO NOT swiftly rip tape off like a band-aid, as this could cause skin tears and additional skin irritation.   Access Code: [email or web address] URL: [email or web address] Date: 02/24/2021 Prepared by: [facility] [place]  Exercises Single Leg Balance with Clock Reach - 1 x daily - 7 x weekly - 1 sets - 10 reps Supine Bridge - 1 x daily - 7 x weekly - 1-2 sets - 10 reps - 5 hold Sidelying Hip Abduction - 1 x daily - 7 x weekly - 2 sets - 10 reps Supine ITB Stretch with Strap - 1-2 x daily - 7 x weekly - 1 sets - 2 reps - 20 seconds hold Hip Adductors and Hamstring Stretch with Strap - 1-2 x daily - 7 x weekly - 1 sets - 2  reps - 20 seconds hold Prone Quadriceps Stretch with Strap - 1-2 x daily - 7 x weekly - 1 sets - 2 reps - 20 seconds hold

## 2021-02-24 NOTE — Therapy (Signed)
[place], [place], [address] Phone: [phone]   Fax:  [phone]  Physical Therapy Treatment  Patient Details  Name: Cheryl Rogers MRN: [id number] Date of Birth: 1945-10-04 Referring Provider (PT): Dianah Field   Encounter Date: 02/24/2021   PT End of Session - 02/24/21 1537     Visit Number 3    Number of Visits 12    Date for PT Re-Evaluation 03/23/21    PT Start Time 3953    PT Stop Time 1617    PT Time Calculation (min) 44 min    Activity Tolerance Patient tolerated treatment well    Behavior During Therapy [facility] for tasks assessed/performed             Past Medical History:  Diagnosis Date   Arthritis    Asthma    Thyroid disease     Past Surgical History:  Procedure Laterality Date   APPENDECTOMY     REPLACEMENT TOTAL KNEE Right     There were no vitals filed for this visit.   Subjective Assessment - 02/24/21 1535     Subjective Pt reports that her movement with LLE is much better and she is able to don/doff clothes easier.  She feels like "something is in knee".  She is using robe tie for stretching and found a roller.    Patient Stated Goals decrease pain and be able to work in yard without pain    Currently in Pain? No/denies    Pain Score 0-No pain                [facility] PT Assessment - 02/24/21 0001       Assessment   Medical Diagnosis Lt knee OA    Referring Provider (PT) Thekkekandam    Onset Date/Surgical Date 11/25/20    Prior Therapy after Rt knee replacement               [facility] Adult PT Treatment/Exercise - 02/24/21 0001       Knee/Hip Exercises: Stretches   Passive Hamstring Stretch Left;Right;2 reps;20 seconds    Quad Stretch Left;2 reps;30 seconds;Right;1 rep   prone with strap   ITB Stretch Left;20 seconds;1 rep   supine with strap   Other Knee/Hip Stretches supine Lt adductor stretch with strap x 2 reps of 20 sec      Knee/Hip  Exercises: Aerobic   Nustep L5: 5 min for warm up (arms/legs)      Knee/Hip Exercises: Standing   Forward Step Up Left;2 sets;5 reps;Right;1 set;Hand Hold: 2   12" step   SLS Lt/Rt SLS with toe taps front, side, back x 8 each without UE support.    Other Standing Knee Exercises resisted single leg clam with green band; single leg bridge pushing off of wall x 5 each leg.      Knee/Hip Exercises: Supine   Bridges Strengthening;1 set;20 reps      Knee/Hip Exercises: Sidelying   Hip ABduction Strengthening;Left;1 set;10 reps;Right   with 5 pulses per rep                         PT Long Term Goals - 02/24/21 1541       PT LONG TERM GOAL #1   Title Pt will be independent with HEP    Time 6    Period Weeks    Status On-going    Target Date 03/23/21  PT LONG TERM GOAL #2   Title Pt will improve FOTO to >=70 to demo improved functional mobility    Time 6    Period Weeks    Status On-going    Target Date 03/23/21      PT LONG TERM GOAL #3   Title Pt will tolerate standing up from toilet seat with pain <= 1/10    Time 6    Period Weeks    Status Achieved    Target Date 03/23/21      PT LONG TERM GOAL #4   Title Pt will climb onto stool and/or ladder with pain <= 2/10    Time 6    Period Weeks    Status On-going    Target Date 03/23/21                   Plan - 02/24/21 1546     Clinical Impression Statement Pt making good gains with reported reduction in pain in Lt knee.  Bilat quads remain tight.  Some cramping in Lt hamstring with bridges..  Trial of Ktape applied at distal ITB to increase proprioception and decompress tissue. Updated HEP. Pt has met LTG #3 and making good progress towards remaining goals.    Personal Factors and Comorbidities Comorbidity 2;Past/Current Experience;Behavior Pattern    Examination-Activity Limitations Transfers;Bend;Locomotion Level    Examination-Participation Restrictions Community Activity;Yard Work;Cleaning     Stability/Clinical Decision Making Evolving/Moderate complexity    Rehab Potential Good    PT Frequency 2x / week    PT Duration 6 weeks    PT Treatment/Interventions Aquatic Therapy;Electrical Stimulation;Iontophoresis 54m/ml Dexamethasone;Cryotherapy;Moist Heat;Stair training;Gait training;Therapeutic exercise;Balance training;Neuromuscular re-education;Patient/family education;Therapeutic activities;Manual techniques;Passive range of motion;Dry needling;Vasopneumatic Device;Taping    PT Next Visit Plan progress knee strength and ROM; assess response to tape.    PT Home Exercise Plan [phone]   Consulted and Agree with Plan of Care Patient             Patient will benefit from skilled therapeutic intervention in order to improve the following deficits and impairments:  Pain, Decreased strength, Decreased range of motion, Decreased activity tolerance, Decreased mobility, Difficulty walking, Increased muscle spasms, Impaired flexibility  Visit Diagnosis: Acute pain of left knee  Difficulty in walking, not elsewhere classified  Other symptoms and signs involving the musculoskeletal system     Problem List Patient Active Problem List   Diagnosis Date Noted   Primary osteoarthritis of left knee 02/02/2021   Chronic right hip pain 10/26/2020   Insomnia 07/08/2020   History of total knee arthroplasty, right 05/25/2020   Immunization refused 05/06/2020   Hypothyroidism (acquired) 05/06/2020   GAD (generalized anxiety disorder) 05/06/2020   Controlled substance agreement signed 05/06/2020   JKerin Perna PTA 02/24/21 6:00 PM  [place] [address]Phone: [phone]  Fax:  [phone] Name: Cheryl MarengoMRN: [id number] of Birth: 706/18/1947

## 2021-03-02 ENCOUNTER — Other Ambulatory Visit: Payer: Self-pay

## 2021-03-02 ENCOUNTER — Ambulatory Visit: Payer: Federal, State, Local not specified - PPO | Admitting: Sports Medicine

## 2021-03-02 DIAGNOSIS — Z96651 Presence of right artificial knee joint: Secondary | ICD-10-CM

## 2021-03-02 DIAGNOSIS — M1712 Unilateral primary osteoarthritis, left knee: Secondary | ICD-10-CM

## 2021-03-02 MED ORDER — TRAMADOL HCL 50 MG PO TABS: 50.0000 mg | ORAL_TABLET | Freq: Three times a day (TID) | ORAL | 0 refills | Status: DC | PRN

## 2021-03-02 NOTE — Assessment & Plan Note (Signed)
X-ray confirmed osteoarthritis, has done really well with Celebrex, physical therapy, almost no pain. She would like a refill of Ultram, happy to do this, return as needed.

## 2021-03-02 NOTE — Progress Notes (Signed)
° ° °  Procedures performed today:    None.  Independent interpretation of notes and tests performed by another provider:   None.  Brief History, Exam, Impression, and Recommendations:    Primary osteoarthritis of left knee X-ray confirmed osteoarthritis, has done really well with Celebrex, physical therapy, almost no pain. She would like a refill of Ultram, happy to do this, return as needed.    ___________________________________________ Ihor Austin. Benjamin Stain, M.D., ABFM., CAQSM. Primary Care and Sports Medicine [facility]  Adjunct Instructor of Family Medicine  [employer]

## 2021-03-03 ENCOUNTER — Encounter: Payer: Federal, State, Local not specified - PPO | Admitting: Physical Therapy

## 2021-03-10 ENCOUNTER — Other Ambulatory Visit: Payer: Self-pay

## 2021-03-10 ENCOUNTER — Encounter: Payer: Self-pay | Admitting: Physical Therapy

## 2021-03-10 ENCOUNTER — Ambulatory Visit: Payer: Federal, State, Local not specified - PPO | Attending: Sports Medicine | Admitting: Physical Therapy

## 2021-03-10 DIAGNOSIS — M25562 Pain in left knee: Secondary | ICD-10-CM

## 2021-03-10 DIAGNOSIS — R29898 Other symptoms and signs involving the musculoskeletal system: Secondary | ICD-10-CM

## 2021-03-10 DIAGNOSIS — M1712 Unilateral primary osteoarthritis, left knee: Secondary | ICD-10-CM | POA: Insufficient documentation

## 2021-03-10 DIAGNOSIS — R262 Difficulty in walking, not elsewhere classified: Secondary | ICD-10-CM

## 2021-03-10 NOTE — Patient Instructions (Signed)

## 2021-03-10 NOTE — Therapy (Signed)
[facility] [place] [address] [place], [place], [address] Phone: [phone]   Fax:  [phone]  Physical Therapy Treatment  Patient Details  Name: Cheryl Rogers MRN: [id number] Date of Birth: 1946-01-22 Referring Provider (PT): Thekkekandam   Encounter Date: 03/10/2021   PT End of Session - 03/10/21 1509     Visit Number 4    Number of Visits 12    Date for PT Re-Evaluation 03/23/21    PT Start Time 1509    PT Stop Time 1557    PT Time Calculation (min) 48 min    Activity Tolerance Patient tolerated treatment well    Behavior During Therapy [facility] for tasks assessed/performed             Past Medical History:  Diagnosis Date   Arthritis    Asthma    Thyroid disease     Past Surgical History:  Procedure Laterality Date   APPENDECTOMY     REPLACEMENT TOTAL KNEE Right     There were no vitals filed for this visit.   Subjective Assessment - 03/10/21 1509     Subjective Pt reports she has no pain with walking, but has some "zinging" with bending Lt knee in certain ways.  She reports her pain is better than it was when she first started PT.    Pertinent History Rt knee replacement    Diagnostic tests x ray shows Lt knee OA    Patient Stated Goals decrease pain and be able to work in yard without pain    Currently in Pain? No/denies    Pain Score 0-No pain                [facility] PT Assessment - 03/10/21 0001       Assessment   Medical Diagnosis Lt knee OA    Referring Provider (PT) Thekkekandam    Onset Date/Surgical Date 11/25/20    Next MD Visit PRN    Prior Therapy after Rt knee replacement      Flexibility   Quadriceps ~95 bilat with prone stretch.               [facility] Adult PT Treatment/Exercise - 03/10/21 0001       Knee/Hip Exercises: Stretches   Passive Hamstring Stretch Right;30 seconds;2 reps;Left;3 reps   standing with leg on 12" step   Quad Stretch Left;2 reps;30  seconds;Right;1 rep   prone with strap     Knee/Hip Exercises: Aerobic   Nustep L5: 5 min for warm up (arms/legs)      Knee/Hip Exercises: Standing   Forward Step Up Limitations 5 reps onto 12" step with BUE on rails    SLS R/L SLS forward leans to touch chair seat x 8 each      Knee/Hip Exercises: Seated   Stool Scoot - Round Trips 10 ft      Manual Therapy   Manual Therapy Soft tissue mobilization;Taping    Soft tissue mobilization IASTM (not tolerated- petechiae instantly); STM to prox Lt gastroc/soleus and Lt biceps femoris to decrease fsacial restrictions and improve mobility.    Kinesiotex IT consultant I strip of reg rock tape applied with 25% stretch to Lt distal biceps femoris and perpendicular strip over area of most restriction.  - to decompress tissue and increase proprioception.  PT Education - 03/10/21 1707     Education Details DN info    Person(s) Educated Patient    Methods Explanation    Comprehension Verbalized understanding                 PT Long Term Goals - 03/10/21 1708       PT LONG TERM GOAL #1   Title Pt will be independent with HEP    Time 6    Period Weeks    Status On-going    Target Date 03/23/21      PT LONG TERM GOAL #2   Title Pt will improve FOTO to >=70 to demo improved functional mobility    Time 6    Period Weeks    Status On-going    Target Date 03/23/21      PT LONG TERM GOAL #3   Title Pt will tolerate standing up from toilet seat with pain <= 1/10    Time 6    Period Weeks    Status Achieved    Target Date 03/23/21      PT LONG TERM GOAL #4   Title Pt will climb onto stool and/or ladder with pain <= 2/10    Time 6    Period Weeks    Status Achieved    Target Date 03/23/21                   Plan - 03/10/21 1516     Clinical Impression Statement Pt had increased pain in posterior lateral Lt thigh/knee with flexion during NuStep, and with  stool scoots.  Lt biceps femoris tight and banded at distal portion; tender with STM.  Pt may benefit from DN/ manual therapy to area at next visit.  Progressing well towards remaining goals.    Personal Factors and Comorbidities Comorbidity 2;Past/Current Experience;Behavior Pattern    Examination-Activity Limitations Transfers;Bend;Locomotion Level    Examination-Participation Restrictions Community Activity;Yard Work;Cleaning    Stability/Clinical Decision Making Evolving/Moderate complexity    Rehab Potential Good    PT Frequency 2x / week    PT Duration 6 weeks    PT Treatment/Interventions Aquatic Therapy;Electrical Stimulation;Iontophoresis 4mg /ml Dexamethasone;Cryotherapy;Moist Heat;Stair training;Gait training;Therapeutic exercise;Balance training;Neuromuscular re-education;Patient/family education;Therapeutic activities;Manual techniques;Passive range of motion;Dry needling;Vasopneumatic Device;Taping    PT Next Visit Plan progress knee strength and ROM; assess response to tape.    PT Home Exercise Plan [phone]    Consulted and Agree with Plan of Care Patient             Patient will benefit from skilled therapeutic intervention in order to improve the following deficits and impairments:  Pain, Decreased strength, Decreased range of motion, Decreased activity tolerance, Decreased mobility, Difficulty walking, Increased muscle spasms, Impaired flexibility  Visit Diagnosis: Acute pain of left knee  Difficulty in walking, not elsewhere classified  Other symptoms and signs involving the musculoskeletal system     Problem List Patient Active Problem List   Diagnosis Date Noted   Primary osteoarthritis of left knee 02/02/2021   Chronic right hip pain 10/26/2020   Insomnia 07/08/2020   History of total knee arthroplasty, right 05/25/2020   Immunization refused 05/06/2020   Hypothyroidism (acquired) 05/06/2020   GAD (generalized anxiety disorder) 05/06/2020   Controlled  substance agreement signed 05/06/2020    07/06/2020 03/10/2021, 5:12 PM  [facility] [address] [place], [place], [place] Phone: [phone]   Fax:  [phone]  Name: Cheryl Rogers MRN: [name]  Date of Birth: [birth year]

## 2021-03-14 ENCOUNTER — Ambulatory Visit: Payer: Federal, State, Local not specified - PPO | Admitting: Family Medicine

## 2021-03-14 ENCOUNTER — Other Ambulatory Visit: Payer: Self-pay

## 2021-03-14 ENCOUNTER — Encounter: Payer: Self-pay | Admitting: Physical Therapy

## 2021-03-14 ENCOUNTER — Ambulatory Visit: Payer: Federal, State, Local not specified - PPO | Admitting: Physical Therapy

## 2021-03-14 DIAGNOSIS — R29898 Other symptoms and signs involving the musculoskeletal system: Secondary | ICD-10-CM

## 2021-03-14 DIAGNOSIS — M25562 Pain in left knee: Secondary | ICD-10-CM

## 2021-03-14 DIAGNOSIS — R262 Difficulty in walking, not elsewhere classified: Secondary | ICD-10-CM

## 2021-03-14 DIAGNOSIS — M1712 Unilateral primary osteoarthritis, left knee: Secondary | ICD-10-CM | POA: Diagnosis not present

## 2021-03-14 NOTE — Therapy (Signed)
[place], [place], [address] Phone: [phone]   Fax:  [phone]  Physical Therapy Treatment  Patient Details  Name: Cheryl Rogers MRN: [id number] Date of Birth: 1945/11/05 Referring Provider (PT): Thekkekandam   Encounter Date: 03/14/2021   PT End of Session - 03/14/21 1416     Visit Number 5    Number of Visits 12    Date for PT Re-Evaluation 03/23/21    PT Start Time [id number]    PT Stop Time 1503    PT Time Calculation (min) 48 min    Activity Tolerance Patient tolerated treatment well    Behavior During Therapy [facility] for tasks assessed/performed             Past Medical History:  Diagnosis Date   Arthritis    Asthma    Thyroid disease     Past Surgical History:  Procedure Laterality Date   APPENDECTOMY     REPLACEMENT TOTAL KNEE Right     There were no vitals filed for this visit.   Subjective Assessment - 03/14/21 1417     Subjective My leg feels so much better after last visit. Feels with hip and knee flexion.    Pertinent History Rt knee replacement    Patient Stated Goals decrease pain and be able to work in yard without pain    Currently in Pain? Yes    Pain Score 4     Pain Location Knee    Pain Orientation Left;Lateral    Pain Descriptors / Indicators [facility] Adult PT Treatment/Exercise - 03/14/21 0001       Knee/Hip Exercises: Stretches   Passive Hamstring Stretch Right;30 seconds;2 reps;Left;3 reps   standing with leg on 12" step   Passive Hamstring Stretch Limitations also in long sitting    Hip Flexor Stretch 1 rep;30 seconds;2 reps;Left;Right    Hip Flexor Stretch Limitations supine off EOB with strap (2 on left side); also attempted childs pose postion for stretch and 1/2 kneel and warrior stretch    ITB Stretch 1 rep;Left;20 seconds    ITB Stretch Limitations SDLY off EOB; no significant stretch  reported      Knee/Hip Exercises: Aerobic   Nustep L5: 6 min for warm up (arms/legs)      Knee/Hip Exercises: Standing   Forward Step Up Limitations 15 reps onto 12" step with BUE on rails      Knee/Hip Exercises: Prone   Hamstring Curl 10 reps    Hamstring Curl Limitations eccentric lowering with 4# wt      Manual Therapy   Manual Therapy Soft tissue mobilization    Manual therapy comments skilled palpation and monitoring of soft tissues during DN    Soft tissue mobilization to left lateral quad/HS distally, left lateral gastroc              Trigger Point Dry Needling - 03/14/21 0001     Consent Given? Yes    Education Handout Provided Previously provided    Muscles Treated Lower Quadrant Vastus lateralis;Popliteus;Gastrocnemius;Hamstring    Dry Needling Comments left    Vastus lateralis Response Palpable increased muscle length    Popliteus Response Palpable increased muscle length    Hamstring Response Twitch response elicited   biceps femoris   Gastrocnemius Response Twitch  response elicited   lateral                  PT Education - 03/14/21 1510     Education Details supine HF/quad stretch with strap    Person(s) Educated Patient    Methods Explanation;Demonstration;Handout    Comprehension Verbalized understanding;Returned demonstration                 PT Long Term Goals - 03/10/21 1708       PT LONG TERM GOAL #1   Title Pt will be independent with HEP    Time 6    Period Weeks    Status On-going    Target Date 03/23/21      PT LONG TERM GOAL #2   Title Pt will improve FOTO to >=70 to demo improved functional mobility    Time 6    Period Weeks    Status On-going    Target Date 03/23/21      PT LONG TERM GOAL #3   Title Pt will tolerate standing up from toilet seat with pain <= 1/10    Time 6    Period Weeks    Status Achieved    Target Date 03/23/21      PT LONG TERM GOAL #4   Title Pt will climb onto stool and/or ladder with  pain <= 2/10    Time 6    Period Weeks    Status Achieved    Target Date 03/23/21                   Plan - 03/14/21 1511     Clinical Impression Statement Patient reporting significant improvement after IASTM last visit but still feeling twinge of pain with left hip/knee flexion. Initial trial of DN went very well with ++ twitch response in lateral gastroc head and biceps femoris. Cheryl Rogers was able to flex her hip/knee with increased mobility immediately following manual therapy. She continues to progress with LTGs.    PT Frequency 2x / week    PT Duration 6 weeks    PT Treatment/Interventions Aquatic Therapy;Electrical Stimulation;Iontophoresis 4mg /ml Dexamethasone;Cryotherapy;Moist Heat;Stair training;Gait training;Therapeutic exercise;Balance training;Neuromuscular re-education;Patient/family education;Therapeutic activities;Manual techniques;Passive range of motion;Dry needling;Vasopneumatic Device;Taping    PT Next Visit Plan Assess DN, continue with IASTM and DN as indicated; progress knee strength and ROM; assess response to tape.    PT Home Exercise Plan [phone]    Consulted and Agree with Plan of Care Patient             Patient will benefit from skilled therapeutic intervention in order to improve the following deficits and impairments:  Pain, Decreased strength, Decreased range of motion, Decreased activity tolerance, Decreased mobility, Difficulty walking, Increased muscle spasms, Impaired flexibility  Visit Diagnosis: Acute pain of left knee  Difficulty in walking, not elsewhere classified  Other symptoms and signs involving the musculoskeletal system     Problem List Patient Active Problem List   Diagnosis Date Noted   Primary osteoarthritis of left knee 02/02/2021   Chronic right hip pain 10/26/2020   Insomnia 07/08/2020   History of total knee arthroplasty, right 05/25/2020   Immunization refused 05/06/2020   Hypothyroidism (acquired) 05/06/2020    GAD (generalized anxiety disorder) 05/06/2020   Controlled substance agreement signed 05/06/2020    Madelyn Flavors, PT 03/14/2021, 3:14 PM  [place], [place], [address] Phone: [phone]   Fax:  [phone]  Name: Cheryl Rogers  MRN: [id number] Date of Birth: [birth year]

## 2021-03-14 NOTE — Patient Instructions (Signed)
Access Code: [email or web address] URL: [email or web address] Date: 03/14/2021 Prepared by: Raynelle Fanning  Exercises Single Leg Balance with Clock Reach - 1 x daily - 7 x weekly - 1 sets - 10 reps Supine Bridge - 1 x daily - 7 x weekly - 1-2 sets - 10 reps - 5 hold Sidelying Hip Abduction - 1 x daily - 7 x weekly - 2 sets - 10 reps Supine ITB Stretch with Strap - 1-2 x daily - 7 x weekly - 1 sets - 2 reps - 20 seconds hold Hip Adductors and Hamstring Stretch with Strap - 1-2 x daily - 7 x weekly - 1 sets - 2 reps - 20 seconds hold Prone Quadriceps Stretch with Strap - 1-2 x daily - 7 x weekly - 1 sets - 2 reps - 20 seconds hold Supine Quadriceps Stretch with Strap on Table - 2 x daily - 7 x weekly - 1 sets - 3 reps - 30-60 sec hold

## 2021-03-16 ENCOUNTER — Ambulatory Visit: Payer: Federal, State, Local not specified - PPO | Admitting: Family Medicine

## 2021-03-16 ENCOUNTER — Encounter: Payer: Self-pay | Admitting: Family Medicine

## 2021-03-16 ENCOUNTER — Other Ambulatory Visit: Payer: Self-pay

## 2021-03-16 VITALS — BP 139/53 | HR 83 | Temp 98.4°F | Ht 62.0 in | Wt 133.0 lb

## 2021-03-16 DIAGNOSIS — F5104 Psychophysiologic insomnia: Secondary | ICD-10-CM

## 2021-03-16 DIAGNOSIS — J069 Acute upper respiratory infection, unspecified: Secondary | ICD-10-CM

## 2021-03-16 DIAGNOSIS — E039 Hypothyroidism, unspecified: Secondary | ICD-10-CM

## 2021-03-16 MED ORDER — CEFTRIAXONE SODIUM 1 G IJ SOLR: 1.0000 g | Freq: Once | INTRAMUSCULAR | Status: DC

## 2021-03-16 NOTE — Progress Notes (Signed)
Cheryl Rogers - 76 y.o. female MRN [id number]  Date of birth: [birth year]  Subjective Chief Complaint  Patient presents with   Insomnia    HPI Cheryl Rogers a 76 year old female here today for follow-up visit.  She reports she is doing well at this time.  She continues on levothyroxine for management of hypothyroidism, feels good at current dose.  She would like to Discontinue alprazolam for management of her insomnia.  She has tried several medications in the past for management of her insomnia.  She is looking into trying a [employer] device to help with insomnia.    ROS:  A comprehensive ROS was completed and negative except as noted per HPI  Allergies  Allergen Reactions   Iodine Rash    Past Medical History:  Diagnosis Date   Arthritis    Asthma    Thyroid disease     Past Surgical History:  Procedure Laterality Date   APPENDECTOMY     REPLACEMENT TOTAL KNEE Right     Social History   Socioeconomic History   Marital status: Divorced    Spouse name: Not on file   Number of children: 1   Years of education: Not on file   Highest education level: Not on file  Occupational History   Occupation: Retired  Tobacco Use   Smoking status: Never   Smokeless tobacco: Never  Vaping Use   Vaping Use: Never used  Substance and Sexual Activity   Alcohol use: Not Currently   Drug use: Never   Sexual activity: Not Currently    Partners: Male  Other Topics Concern   Not on file  Social History Narrative   Not on file   Social Determinants of Health   Financial Resource Strain: Not on file  Food Insecurity: Not on file  Transportation Needs: Not on file  Physical Activity: Not on file  Stress: Not on file  Social Connections: Not on file    Family History  Problem Relation Age of Onset   Healthy Mother    Healthy Father     Health Maintenance  Topic Date Due   Hepatitis C Screening  Never done   COVID-19 Vaccine (1) 04/01/2021 (Originally 03/23/1946)    INFLUENZA VACCINE  05/27/2021 (Originally 09/27/2020)   Zoster Vaccines- Shingrix (2 of 2) 06/14/2021 (Originally 04/24/2012)   Pneumonia Vaccine 83+ Years old (1 - PCV) 03/16/2022 (Originally 09/21/1951)   DEXA SCAN  03/16/2022 (Originally 09/20/2009)   TETANUS/TDAP  03/16/2022 (Originally 09/21/1963)   HPV VACCINES  Aged Out     ----------------------------------------------------------------------------------------------------------------------------------------------------------------------------------------------------------------- Physical Exam BP (!) 139/53 (BP Location: Left Arm, Patient Position: Sitting, Cuff Size: Normal)    Pulse 83    Temp 98.4 F (36.9 C)    Ht 5\' 2"  (1.575 m)    Wt 133 lb (60.3 kg)    SpO2 98%    BMI 24.33 kg/m   Physical Exam Constitutional:      Appearance: Normal appearance.  Eyes:     General: No scleral icterus. Cardiovascular:     Rate and Rhythm: Normal rate and regular rhythm.  Pulmonary:     Effort: Pulmonary effort is normal.     Breath sounds: Normal breath sounds.  Musculoskeletal:     Cervical back: Neck supple.  Neurological:     Mental Status: She is alert.  Psychiatric:        Mood and Affect: Mood normal.        Behavior: Behavior normal.    ------------------------------------------------------------------------------------------------------------------------------------------------------------------------------------------------------------------- Assessment and  Plan  Hypothyroidism (acquired) Feels good at current dose of levothyroxine.  We will continue at current strength.  Recheck TSH at follow-up in 6 months.  Insomnia Goal is to get off of alprazolam.  She is going to try a [employer] device to see if this is beneficial for her insomnia.  I think this is reasonable to try.   Meds ordered this encounter  Medications   DISCONTD: cefTRIAXone (ROCEPHIN) injection 1 g    Return in about 6 months (around 09/13/2021) for  Thyroid/Insomnia.    This visit occurred during the SARS-CoV-2 public health emergency.  Safety protocols were in place, including screening questions prior to the visit, additional usage of staff PPE, and extensive cleaning of exam room while observing appropriate contact time as indicated for disinfecting solutions.

## 2021-03-16 NOTE — Assessment & Plan Note (Signed)
Feels good at current dose of levothyroxine.  We will continue at current strength.  Recheck TSH at follow-up in 6 months.

## 2021-03-16 NOTE — Assessment & Plan Note (Signed)
Goal is to get off of alprazolam.  She is going to try a [employer] device to see if this is beneficial for her insomnia.  I think this is reasonable to try.

## 2021-03-17 NOTE — Telephone Encounter (Signed)
Updated.  Incorrect diagnosis and medication entered on wrong patient by CMA.

## 2021-03-21 ENCOUNTER — Ambulatory Visit: Payer: Federal, State, Local not specified - PPO | Admitting: Rehabilitative and Restorative Service Providers"

## 2021-03-21 ENCOUNTER — Other Ambulatory Visit: Payer: Self-pay

## 2021-03-21 ENCOUNTER — Encounter: Payer: Self-pay | Admitting: Rehabilitative and Restorative Service Providers"

## 2021-03-21 DIAGNOSIS — M25562 Pain in left knee: Secondary | ICD-10-CM

## 2021-03-21 DIAGNOSIS — R262 Difficulty in walking, not elsewhere classified: Secondary | ICD-10-CM

## 2021-03-21 DIAGNOSIS — R29898 Other symptoms and signs involving the musculoskeletal system: Secondary | ICD-10-CM

## 2021-03-21 DIAGNOSIS — M1712 Unilateral primary osteoarthritis, left knee: Secondary | ICD-10-CM | POA: Diagnosis not present

## 2021-03-21 NOTE — Therapy (Addendum)
[facility], [place], [address] Phone: [phone]   Fax:  [phone]  Physical Therapy Treatment and Discharge Summary  .PHYSICAL THERAPY DISCHARGE SUMMARY  Visits from Start of Care: 6  Current functional level related to goals / functional outcomes: See last progress note for discharge status   Remaining deficits: Unknown    Education / Equipment: HEP   Patient agrees to discharge. Patient goals were partially met. Patient is being discharged due to being pleased with the current functional level.  Veena Sturgess P. Helene Kelp PT, MPH 04/07/21 1:02 PM  Patient Details  Name: Cheryl Rogers MRN: [id number] Date of Birth: [birth year] Referring Provider (PT): Dianah Field   Encounter Date: 03/21/2021   PT End of Session - 03/21/21 1612     Visit Number 6    Number of Visits 12    Date for PT Re-Evaluation 03/23/21    PT Start Time 1600    PT Stop Time 1645    PT Time Calculation (min) 45 min    Activity Tolerance Treatment limited secondary to medical complications (Comment)             Past Medical History:  Diagnosis Date   Arthritis    Asthma    Thyroid disease     Past Surgical History:  Procedure Laterality Date   APPENDECTOMY     REPLACEMENT TOTAL KNEE Right     There were no vitals filed for this visit.   Subjective Assessment - 03/21/21 1612     Subjective Patient reports the needling and IASTM and taping helped. Much better in the past two weeks. Wants to try the exercises this time and hold on the other. States that sometimes the leg will hurt with certain movements ane other times no problem. (She had pain with IR of hip in sitting dropping knee in.) Has come a log way. Was out walking a couple of hours today and she did OK which is much better than she was doing with walking.    Currently in Pain? No/denies    Pain Score 0-No pain    Pain Location Knee    Pain Orientation  Left;Lateral                [facility] PT Assessment - 03/21/21 0001       Assessment   Medical Diagnosis Lt knee OA    Referring Provider (PT) Thekkekandam    Onset Date/Surgical Date 11/25/20    Next MD Visit PRN    Prior Therapy after Rt knee replacement      AROM   Right Knee Extension 0    Right Knee Flexion 121    Left Knee Extension -1    Left Knee Flexion 118                           [facility] Adult PT Treatment/Exercise - 03/21/21 0001       Knee/Hip Exercises: Stretches   Passive Hamstring Stretch Left;2 reps;30 seconds    Passive Hamstring Stretch Limitations supine    ITB Stretch Left;2 reps;30 seconds    ITB Stretch Limitations supine focus on knee straight    Piriformis Stretch Left;3 reps;Right;2 reps;30 seconds    Piriformis Stretch Limitations supine travell      Knee/Hip Exercises: Aerobic   Nustep L5: 6 min for warm up (legs)      Knee/Hip Exercises: Standing   Terminal Knee Extension Strengthening;Left;10 reps;Theraband  Theraband Level (Terminal Knee Extension) Level 4 (Blue)    Terminal Knee Extension Limitations repeated with ball press into wall 10 reps x 10 sec hold    Lateral Step Up Left;Right;10 reps;Step Height: 6";Hand Hold: 1    Lateral Step Up Limitations heel tap without full wt shift    Forward Step Up Limitations 15 reps onto 12" step with BUE on rails    SLS Rt/Lt x 15 sed x 3 side    SLS with Vectors Rt/Lt SLS withforward reach to touch chair x 5 each    Gait Training forward walking slow steps x 25 ft x 4; step kick x 25 ft x 3 reps; backwards walking x 25 ft x 4 reps      Knee/Hip Exercises: Supine   Quad Sets Strengthening;Left;10 reps    Quad Sets Limitations 10 sec hold PT providing resistance posterior knee to facilitate quad contraction foot rested on bolster    Short Arc [employer] Strengthening;Left;10 reps    Short Arc Quad Sets Limitations 10 sec hold                     PT Education -  03/21/21 1647     Education Details HEP    Person(s) Educated Patient    Methods Explanation;Demonstration;Tactile cues;Verbal cues;Handout    Comprehension Verbalized understanding;Returned demonstration;Verbal cues required;Tactile cues required                 PT Long Term Goals - 03/10/21 1708       PT LONG TERM GOAL #1   Title Pt will be independent with HEP    Time 6    Period Weeks    Status On-going    Target Date 03/23/21      PT LONG TERM GOAL #2   Title Pt will improve FOTO to >=70 to demo improved functional mobility    Time 6    Period Weeks    Status On-going    Target Date 03/23/21      PT LONG TERM GOAL #3   Title Pt will tolerate standing up from toilet seat with pain <= 1/10    Time 6    Period Weeks    Status Achieved    Target Date 03/23/21      PT LONG TERM GOAL #4   Title Pt will climb onto stool and/or ladder with pain <= 2/10    Time 6    Period Weeks    Status Achieved    Target Date 03/23/21                   Plan - 03/21/21 1644     Clinical Impression Statement Good improvement in the past two weeks but pt declined additional manual wokr or DN today - wants to work on exercise and see how it goes. Still tight in Lt knee extension - improved with exercise and passive stretch assist with PT. Added HEP for terminal knee extension. Progressing well toward stated goals of therapy.    Rehab Potential Good    PT Frequency 2x / week    PT Duration 6 weeks    PT Treatment/Interventions Aquatic Therapy;Electrical Stimulation;Iontophoresis 55m/ml Dexamethasone;Cryotherapy;Moist Heat;Stair training;Gait training;Therapeutic exercise;Balance training;Neuromuscular re-education;Patient/family education;Therapeutic activities;Manual techniques;Passive range of motion;Dry needling;Vasopneumatic Device;Taping    PT Next Visit Plan Continue with strengtheninf focus on TKE; continued stretching, strengthening and balance activities  - further  IASTM, DN, manual work, taping as indicated  PT Home Exercise Plan [phone]    Consulted and Agree with Plan of Care Patient             Patient will benefit from skilled therapeutic intervention in order to improve the following deficits and impairments:     Visit Diagnosis: Acute pain of left knee  Difficulty in walking, not elsewhere classified  Other symptoms and signs involving the musculoskeletal system     Problem List Patient Active Problem List   Diagnosis Date Noted   Primary osteoarthritis of left knee 02/02/2021   Chronic right hip pain 10/26/2020   Insomnia 07/08/2020   History of total knee arthroplasty, right 05/25/2020   Immunization refused 05/06/2020   Hypothyroidism (acquired) 05/06/2020   GAD (generalized anxiety disorder) 05/06/2020   Controlled substance agreement signed 05/06/2020    Anjuli Gemmill Nilda Simmer, PT, MPH  03/21/2021, 4:59 PM  [facility] [place], [place], [address] Phone: [phone]   Fax:  [phone]  Name: Cheryl Rogers MRN: [id number] Date of Birth: 28-Jan-1946

## 2021-03-21 NOTE — Patient Instructions (Signed)
Access Code: [email or web address] URL: [email or web address] Date: 03/21/2021 Prepared by: Corlis Leak  Exercises Single Leg Balance with Clock Reach - 1 x daily - 7 x weekly - 1 sets - 10 reps Supine Bridge - 1 x daily - 7 x weekly - 1-2 sets - 10 reps - 5 hold Sidelying Hip Abduction - 1 x daily - 7 x weekly - 2 sets - 10 reps Supine ITB Stretch with Strap - 1-2 x daily - 7 x weekly - 1 sets - 2 reps - 20 seconds hold Hip Adductors and Hamstring Stretch with Strap - 1-2 x daily - 7 x weekly - 1 sets - 2 reps - 20 seconds hold Prone Quadriceps Stretch with Strap - 1-2 x daily - 7 x weekly - 1 sets - 2 reps - 20 seconds hold Supine Quadriceps Stretch with Strap on Table - 2 x daily - 7 x weekly - 1 sets - 3 reps - 30-60 sec hold Supine Quad Set - 1 x daily - 7 x weekly - 1 sets - 10 reps - 3 sec hold Supine Knee Extension Strengthening - 1 x daily - 7 x weekly - 1-2 sets - 10 reps - 5 sec hold Standing Terminal Knee Extension with Resistance - 1 x daily - 7 x weekly - 2-3 sets - 10 reps - 5-10 sec hold Standing Terminal Knee Extension at Wall with Ball - 1 x daily - 7 x weekly - 1-2 sets - 10 reps - 5-10 sec hold

## 2021-03-28 ENCOUNTER — Ambulatory Visit: Payer: Federal, State, Local not specified - PPO | Admitting: Rehabilitative and Restorative Service Providers"

## 2021-04-05 ENCOUNTER — Encounter: Payer: Federal, State, Local not specified - PPO | Admitting: Rehabilitative and Restorative Service Providers"

## 2021-04-15 ENCOUNTER — Encounter: Payer: Self-pay | Admitting: Family Medicine

## 2021-04-17 MED ORDER — ALPRAZOLAM 2 MG PO TABS: 1.0000 mg | ORAL_TABLET | Freq: Every evening | ORAL | 0 refills | Status: DC | PRN

## 2021-05-03 ENCOUNTER — Other Ambulatory Visit: Payer: Self-pay | Admitting: Family Medicine

## 2021-07-15 ENCOUNTER — Other Ambulatory Visit: Payer: Self-pay | Admitting: Family Medicine

## 2021-08-01 ENCOUNTER — Other Ambulatory Visit: Payer: Self-pay | Admitting: Family Medicine

## 2021-09-13 ENCOUNTER — Ambulatory Visit: Payer: Federal, State, Local not specified - PPO | Admitting: Family Medicine

## 2021-09-13 ENCOUNTER — Encounter: Payer: Self-pay | Admitting: Family Medicine

## 2021-09-13 VITALS — BP 145/71 | HR 60 | Ht 62.0 in | Wt 138.0 lb

## 2021-09-13 DIAGNOSIS — M1712 Unilateral primary osteoarthritis, left knee: Secondary | ICD-10-CM | POA: Diagnosis not present

## 2021-09-13 DIAGNOSIS — F5104 Psychophysiologic insomnia: Secondary | ICD-10-CM | POA: Diagnosis not present

## 2021-09-13 DIAGNOSIS — Z96651 Presence of right artificial knee joint: Secondary | ICD-10-CM | POA: Diagnosis not present

## 2021-09-13 DIAGNOSIS — E039 Hypothyroidism, unspecified: Secondary | ICD-10-CM | POA: Diagnosis not present

## 2021-09-13 DIAGNOSIS — G8929 Other chronic pain: Secondary | ICD-10-CM

## 2021-09-13 DIAGNOSIS — M25551 Pain in right hip: Secondary | ICD-10-CM

## 2021-09-13 MED ORDER — LATANOPROST 0.005 % OP SOLN: 1.0000 [drp] | Freq: Every day | OPHTHALMIC | 1 refills | Status: DC

## 2021-09-13 MED ORDER — TRAMADOL HCL 50 MG PO TABS: 50.0000 mg | ORAL_TABLET | Freq: Three times a day (TID) | ORAL | 1 refills | Status: DC | PRN

## 2021-09-13 NOTE — Assessment & Plan Note (Signed)
Stable with alprazolam 2mg  qhs.  Recommend that she try to wean back from this some.

## 2021-09-13 NOTE — Progress Notes (Signed)
Cheryl Rogers - 76 y.o. female MRN [id number]  Date of birth: 08-Jul-1945  Subjective Chief Complaint  Patient presents with   Depression    HPI Cheryl Rogers is a 76 y.o. female here today for follow up visit.  Reports that she is doing well at this time. She is planning on moving back to [place].  Having some family stress here.   Continues on levothyroxine at daily.  Doing well with this at current strength.  Due for updated labs.    She continues on alprazolam for insomnia.  She has tried other medications in the past which have been ineffective for her.  She denies side effects related to medication.    ROS:  A comprehensive ROS was completed and negative except as noted per HPI  Allergies  Allergen Reactions   Iodine Rash    Past Medical History:  Diagnosis Date   Arthritis    Asthma    Thyroid disease     Past Surgical History:  Procedure Laterality Date   APPENDECTOMY     REPLACEMENT TOTAL KNEE Right     Social History   Socioeconomic History   Marital status: Divorced    Spouse name: Not on file   Number of children: 1   Years of education: Not on file   Highest education level: Not on file  Occupational History   Occupation: Retired  Tobacco Use   Smoking status: Never   Smokeless tobacco: Never  Vaping Use   Vaping Use: Never used  Substance and Sexual Activity   Alcohol use: Not Currently   Drug use: Never   Sexual activity: Not Currently    Partners: Male  Other Topics Concern   Not on file  Social History Narrative   Not on file   Social Determinants of Health   Financial Resource Strain: Not on file  Food Insecurity: Not on file  Transportation Needs: Not on file  Physical Activity: Not on file  Stress: Not on file  Social Connections: Not on file    Family History  Problem Relation Age of Onset   Healthy Mother    Healthy Father     Health Maintenance  Topic Date Due   COVID-19 Vaccine (1) 12/14/2021 (Originally  03/23/1946)   Zoster Vaccines- Shingrix (2 of 2) 12/14/2021 (Originally 04/24/2012)   Pneumonia Vaccine 41+ Years old (1 - PCV) 03/16/2022 (Originally 09/20/2009)   DEXA SCAN  03/16/2022 (Originally 09/20/2009)   TETANUS/TDAP  03/16/2022 (Originally 09/21/1963)   Hepatitis C Screening  09/14/2022 (Originally 09/21/1962)   INFLUENZA VACCINE  09/27/2021   HPV VACCINES  Aged Out     ----------------------------------------------------------------------------------------------------------------------------------------------------------------------------------------------------------------- Physical Exam BP (!) 147/78 (BP Location: Left Arm, Patient Position: Sitting, Cuff Size: Normal)   Pulse 86   Ht 5\' 2"  (1.575 m)   Wt 138 lb (62.6 kg)   SpO2 97%   BMI 25.24 kg/m   Physical Exam Constitutional:      Appearance: Normal appearance.  Eyes:     General: No scleral icterus. Cardiovascular:     Rate and Rhythm: Normal rate and regular rhythm.  Pulmonary:     Effort: Pulmonary effort is normal.     Breath sounds: Normal breath sounds.  Musculoskeletal:     Cervical back: Neck supple.  Neurological:     General: No focal deficit present.     Mental Status: She is alert.  Psychiatric:        Mood and Affect: Mood normal.  Behavior: Behavior normal.     ------------------------------------------------------------------------------------------------------------------------------------------------------------------------------------------------------------------- Assessment and Plan  Hypothyroidism (acquired) Updating TSH today.  Renewal of levothyroxine sent in.    Insomnia Stable with alprazolam 2mg  qhs.  Recommend that she try to wean back from this some.    Chronic right hip pain Has seen Dr. previously.  Using tramadol occasionally.  Celebrex has not really been helpful.  Tramadol renewed today.  Advised to not take with alprazolam.    Meds ordered this  encounter  Medications   traMADol (ULTRAM) 50 MG tablet    Sig: Take 1-2 tablets (50-100 mg total) by mouth every 8 (eight) hours as needed for moderate pain.    Dispense:  30 tablet    Refill:  1   latanoprost (XALATAN) 0.005 % ophthalmic solution    Sig: Place 1 drop into both eyes at bedtime.    Dispense:  2.5 mL    Refill:  1    No follow-ups on file.    This visit occurred during the SARS-CoV-2 public health emergency.  Safety protocols were in place, including screening questions prior to the visit, additional usage of staff PPE, and extensive cleaning of exam room while observing appropriate contact time as indicated for disinfecting solutions.

## 2021-09-13 NOTE — Assessment & Plan Note (Signed)
Has seen Dr. Benjamin Stain previously.  Using tramadol occasionally.  Celebrex has not really been helpful.  Tramadol renewed today.  Advised to not take with alprazolam.

## 2021-09-13 NOTE — Assessment & Plan Note (Signed)
Updating TSH today.  Renewal of levothyroxine sent in.

## 2021-09-14 LAB — TSH: TSH: 1.59 mIU/L (ref 0.40–4.50)

## 2021-09-15 ENCOUNTER — Other Ambulatory Visit: Payer: Self-pay

## 2021-09-15 MED ORDER — LATANOPROST 0.005 % OP SOLN: 1.0000 [drp] | Freq: Every day | OPHTHALMIC | 0 refills | Status: DC

## 2021-09-19 ENCOUNTER — Other Ambulatory Visit: Payer: Self-pay | Admitting: Family Medicine

## 2021-12-17 ENCOUNTER — Other Ambulatory Visit: Payer: Self-pay | Admitting: Family Medicine

## 2021-12-17 DIAGNOSIS — Z96651 Presence of right artificial knee joint: Secondary | ICD-10-CM

## 2021-12-17 DIAGNOSIS — M1712 Unilateral primary osteoarthritis, left knee: Secondary | ICD-10-CM

## 2021-12-19 NOTE — Telephone Encounter (Signed)
Last office visit 09/13/2021  Last filled 09/13/2021

## 2022-01-18 ENCOUNTER — Other Ambulatory Visit: Payer: Self-pay

## 2022-01-18 MED ORDER — ALPRAZOLAM 2 MG PO TABS: 2.0000 mg | ORAL_TABLET | Freq: Every evening | ORAL | 1 refills | Status: DC | PRN

## 2022-01-18 NOTE — Progress Notes (Signed)
Completed.

## 2022-04-19 ENCOUNTER — Other Ambulatory Visit: Payer: Self-pay | Admitting: Family Medicine

## 2022-04-19 DIAGNOSIS — Z96651 Presence of right artificial knee joint: Secondary | ICD-10-CM

## 2022-04-19 DIAGNOSIS — M1712 Unilateral primary osteoarthritis, left knee: Secondary | ICD-10-CM

## 2022-04-20 NOTE — Telephone Encounter (Signed)
Message sent to patient via Mychart that prescription has been refilled.

## 2022-07-17 ENCOUNTER — Other Ambulatory Visit: Payer: Self-pay | Admitting: Family Medicine

## 2022-07-20 ENCOUNTER — Ambulatory Visit: Payer: Federal, State, Local not specified - PPO | Admitting: Family Medicine

## 2022-07-20 ENCOUNTER — Encounter: Payer: Self-pay | Admitting: Family Medicine

## 2022-07-20 VITALS — BP 126/66 | HR 72 | Ht 62.0 in | Wt 137.0 lb

## 2022-07-20 DIAGNOSIS — Z96651 Presence of right artificial knee joint: Secondary | ICD-10-CM | POA: Diagnosis not present

## 2022-07-20 DIAGNOSIS — Z5181 Encounter for therapeutic drug level monitoring: Secondary | ICD-10-CM | POA: Diagnosis not present

## 2022-07-20 DIAGNOSIS — E039 Hypothyroidism, unspecified: Secondary | ICD-10-CM

## 2022-07-20 DIAGNOSIS — M1712 Unilateral primary osteoarthritis, left knee: Secondary | ICD-10-CM

## 2022-07-20 DIAGNOSIS — F5104 Psychophysiologic insomnia: Secondary | ICD-10-CM

## 2022-07-20 MED ORDER — LEVOTHYROXINE SODIUM 88 MCG PO TABS: ORAL_TABLET | ORAL | 3 refills | Status: DC

## 2022-07-20 MED ORDER — TRAMADOL HCL 50 MG PO TABS: ORAL_TABLET | ORAL | 1 refills | Status: DC

## 2022-07-20 MED ORDER — ALPRAZOLAM 2 MG PO TABS: ORAL_TABLET | ORAL | 1 refills | Status: DC

## 2022-07-20 NOTE — Assessment & Plan Note (Addendum)
Updating Tsh today. Feels well with current strength of levothyroxine.

## 2022-07-20 NOTE — Assessment & Plan Note (Signed)
Celebrex was ineffective.  I did renew her tramadol to use on occasion for pain.

## 2022-07-20 NOTE — Assessment & Plan Note (Signed)
Alprazolam has been the only thing that has been effective for her.  No excess sedation.  Will continue at current strength.

## 2022-07-20 NOTE — Progress Notes (Signed)
Cheryl Rogers - 77 y.o. female MRN [id number]  Date of birth: 11/15/1945  Subjective Chief Complaint  Patient presents with   Medication Refill    HPI Cheryl Rogers is a 77 y.o. female here today for follow up visit. She reports that she is doing well with no new complaints.    Feels well with current dose of levothyroxine.  She is taking daily on an empty stomach.   Sleeping ok with alprazolam  She has tried multiple sleep aids/medications and this has really been the only thing that has worked for her.  No side effects.   Has OA of the knees.  Prescribed celebrex and tramadol.  Doesn't feel like celebrex is effective.  Tramadol does work well for her.    ROS:  A comprehensive ROS was completed and negative except as noted per HPI  Allergies  Allergen Reactions   Iodine Rash    Past Medical History:  Diagnosis Date   Arthritis    Asthma    Thyroid disease     Past Surgical History:  Procedure Laterality Date   APPENDECTOMY     REPLACEMENT TOTAL KNEE Right     Social History   Socioeconomic History   Marital status: Divorced    Spouse name: Not on file   Number of children: 1   Years of education: Not on file   Highest education level: Not on file  Occupational History   Occupation: Retired  Tobacco Use   Smoking status: Never   Smokeless tobacco: Never  Vaping Use   Vaping Use: Never used  Substance and Sexual Activity   Alcohol use: Not Currently   Drug use: Never   Sexual activity: Not Currently    Partners: Male  Other Topics Concern   Not on file  Social History Narrative   Not on file   Social Determinants of Health   Financial Resource Strain: Patient Declined (07/19/2022)   Overall Financial Resource Strain (CARDIA)    Difficulty of Paying Living Expenses: Patient declined  Food Insecurity: Patient Declined (07/19/2022)   Hunger Vital Sign    Worried About Running Out of Food in the Last Year: Patient declined    Ran Out of Food in the  Last Year: Patient declined  Transportation Needs: No Transportation Needs (07/19/2022)   PRAPARE - Administrator, Civil Service (Medical): No    Lack of Transportation (Non-Medical): No  Physical Activity: Sufficiently Active (07/19/2022)   Exercise Vital Sign    Days of Exercise per Week: 5 days    Minutes of Exercise per Session: 120 min  Stress: Stress Concern Present (07/19/2022)   [employer] of Occupational Health - Occupational Stress Questionnaire    Feeling of Stress : To some extent  Social Connections: Unknown (07/19/2022)   Social Connection and Isolation Panel [NHANES]    Frequency of Communication with Friends and Family: Patient declined    Frequency of Social Gatherings with Friends and Family: Patient declined    Attends Religious Services: Patient declined    Database administrator or Organizations: Patient declined    Attends Engineer, structural: Not on file    Marital Status: Divorced    Family History  Problem Relation Age of Onset   Healthy Mother    Healthy Father     Health Maintenance  Topic Date Due   DTaP/Tdap/Td (1 - Tdap) Never done   Hepatitis C Screening  09/14/2022 (Originally 09/21/1962)   COVID-19 Vaccine (1) 01/05/2023 (  Originally 09/20/1950)   Zoster Vaccines- Shingrix (2 of 2) 01/20/2023 (Originally 04/24/2012)   Pneumonia Vaccine 54+ Years old (1 of 1 - PCV) 07/20/2023 (Originally 09/20/2009)   DEXA SCAN  07/20/2023 (Originally 09/20/2009)   INFLUENZA VACCINE  09/28/2022   HPV VACCINES  Aged Out     ----------------------------------------------------------------------------------------------------------------------------------------------------------------------------------------------------------------- Physical Exam BP 126/66 (BP Location: Left Arm, Patient Position: Sitting, Cuff Size: Normal)   Pulse 72   Ht 5\' 2"  (1.575 m)   Wt 137 lb (62.1 kg)   SpO2 98%   BMI 25.06 kg/m   Physical  Exam Constitutional:      Appearance: Normal appearance.  HENT:     Head: Normocephalic and atraumatic.  Eyes:     General: No scleral icterus. Cardiovascular:     Rate and Rhythm: Normal rate and regular rhythm.  Pulmonary:     Effort: Pulmonary effort is normal.     Breath sounds: Normal breath sounds.  Musculoskeletal:     Cervical back: Neck supple.  Neurological:     Mental Status: She is alert.  Psychiatric:        Mood and Affect: Mood normal.        Behavior: Behavior normal.     ------------------------------------------------------------------------------------------------------------------------------------------------------------------------------------------------------------------- Assessment and Plan  Hypothyroidism (acquired) Updating Tsh today. Feels well with current strength of levothyroxine.   Insomnia Alprazolam has been the only thing that has been effective for her.  No excess sedation.  Will continue at current strength.    Primary osteoarthritis of left knee Celebrex was ineffective.  I did renew her tramadol to use on occasion for pain.    Meds ordered this encounter  Medications   alprazolam (XANAX) 2 MG tablet    Sig: TAKE 1 TABLET(2 MG) BY MOUTH AT BEDTIME AS NEEDED FOR SLEEP    Dispense:  90 tablet    Refill:  1   levothyroxine (SYNTHROID) 88 MCG tablet    Sig: TAKE 1 TABLET BY MOUTH EVERY DAY IN THE MORNING ON AN EMPTY STOMACH    Dispense:  90 tablet    Refill:  3   traMADol (ULTRAM) 50 MG tablet    Sig: TAKE 1 TO 2 TABLETS(50 TO 100 MG) BY MOUTH EVERY 8 HOURS AS NEEDED FOR MODERATE PAIN    Dispense:  30 tablet    Refill:  1    Return in about 1 year (around 07/20/2023) for Thyroid.    This visit occurred during the SARS-CoV-2 public health emergency.  Safety protocols were in place, including screening questions prior to the visit, additional usage of staff PPE, and extensive cleaning of exam room while observing appropriate  contact time as indicated for disinfecting solutions.

## 2022-07-21 LAB — CBC WITH DIFFERENTIAL/PLATELET
Absolute Monocytes: 603 cells/uL (ref 200–950)
Basophils Absolute: 67 cells/uL (ref 0–200)
Basophils Relative: 1 %
Eosinophils Absolute: 1809 cells/uL — ABNORMAL HIGH (ref 15–500)
Eosinophils Relative: 27 %
HCT: 34.9 % — ABNORMAL LOW (ref 35.0–45.0)
Hemoglobin: 11.7 g/dL (ref 11.7–15.5)
Lymphs Abs: 1956 cells/uL (ref 850–3900)
MCH: 32.1 pg (ref 27.0–33.0)
MCHC: 33.5 g/dL (ref 32.0–36.0)
MCV: 95.6 fL (ref 80.0–100.0)
MPV: 11.2 fL (ref 7.5–12.5)
Monocytes Relative: 9 %
Neutro Abs: 2265 cells/uL (ref 1500–7800)
Neutrophils Relative %: 33.8 %
Platelets: 295 10*3/uL (ref 140–400)
RBC: 3.65 10*6/uL — ABNORMAL LOW (ref 3.80–5.10)
RDW: 11.9 % (ref 11.0–15.0)
Total Lymphocyte: 29.2 %
WBC: 6.7 10*3/uL (ref 3.8–10.8)

## 2022-07-21 LAB — BASIC METABOLIC PANEL
BUN: 14 mg/dL (ref 7–25)
CO2: 30 mmol/L (ref 20–32)
Calcium: 8.6 mg/dL (ref 8.6–10.4)
Chloride: 105 mmol/L (ref 98–110)
Creat: 0.63 mg/dL (ref 0.60–1.00)
Glucose, Bld: 109 mg/dL — ABNORMAL HIGH (ref 65–99)
Potassium: 4.7 mmol/L (ref 3.5–5.3)
Sodium: 140 mmol/L (ref 135–146)

## 2022-07-21 LAB — TSH: TSH: 6.72 mIU/L — ABNORMAL HIGH (ref 0.40–4.50)

## 2022-08-04 ENCOUNTER — Encounter: Payer: Self-pay | Admitting: Family Medicine

## 2022-08-04 ENCOUNTER — Other Ambulatory Visit: Payer: Self-pay | Admitting: Family Medicine

## 2022-08-04 DIAGNOSIS — E039 Hypothyroidism, unspecified: Secondary | ICD-10-CM

## 2022-08-04 MED ORDER — LEVOTHYROXINE SODIUM 100 MCG PO TABS: ORAL_TABLET | ORAL | 1 refills | Status: DC

## 2022-09-23 IMAGING — DX DG HIP (WITH OR WITHOUT PELVIS) 2-3V*R*
3 series · 3 of 3 positions shown · non-contrast
Comparison: None.

CLINICAL DATA: Right lateral hip pain for the past month. No
injury.

EXAM:
DG HIP (WITH OR WITHOUT PELVIS) 2-3V RIGHT

[pelvis ap]
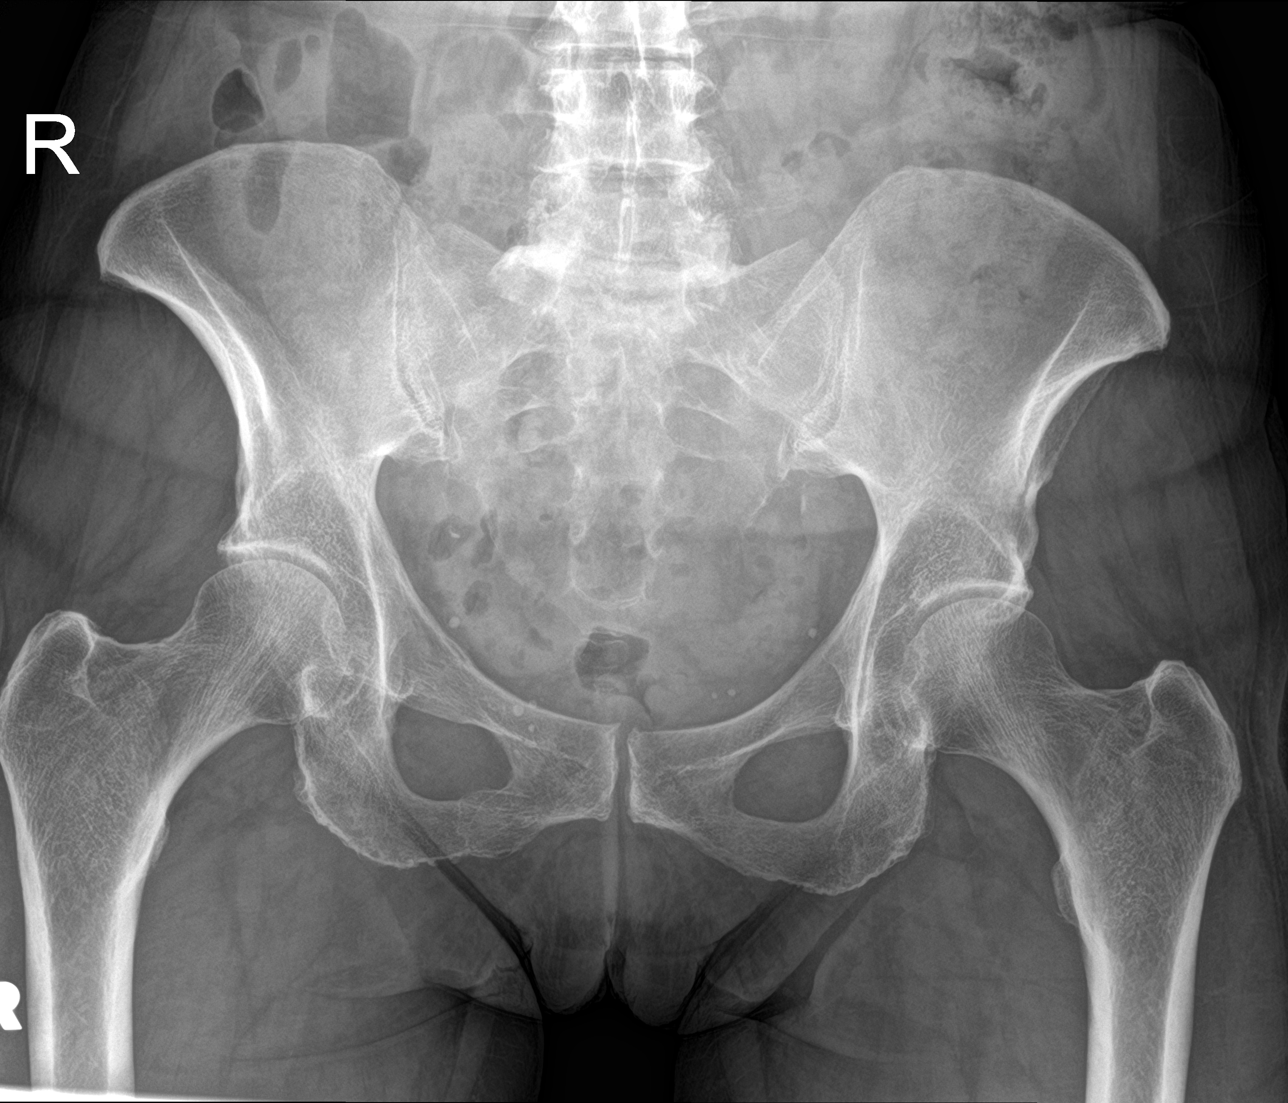

[hip ap]
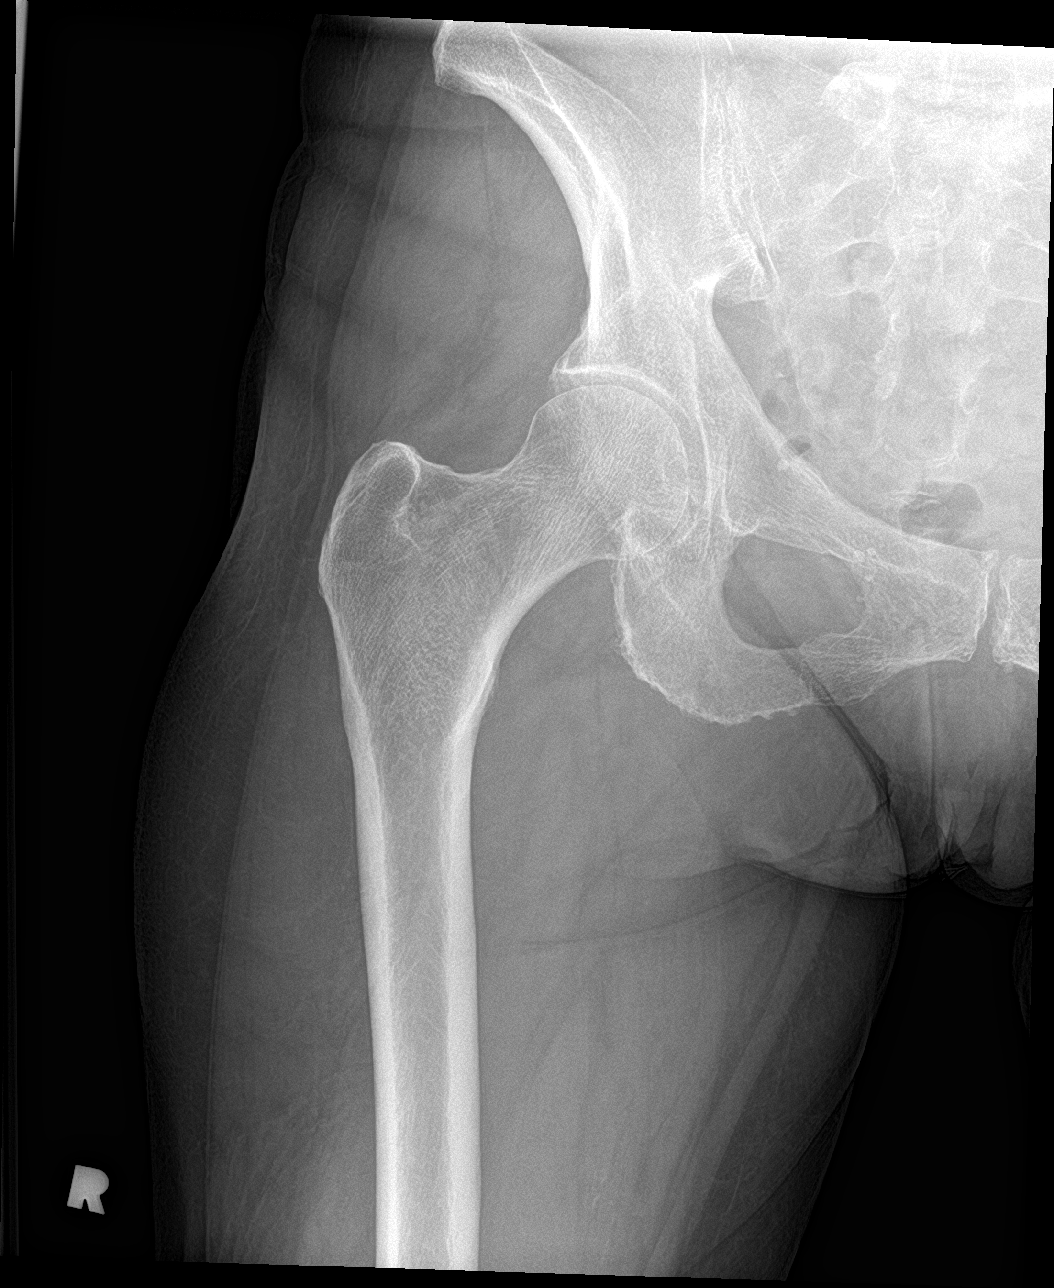

[hip lat]
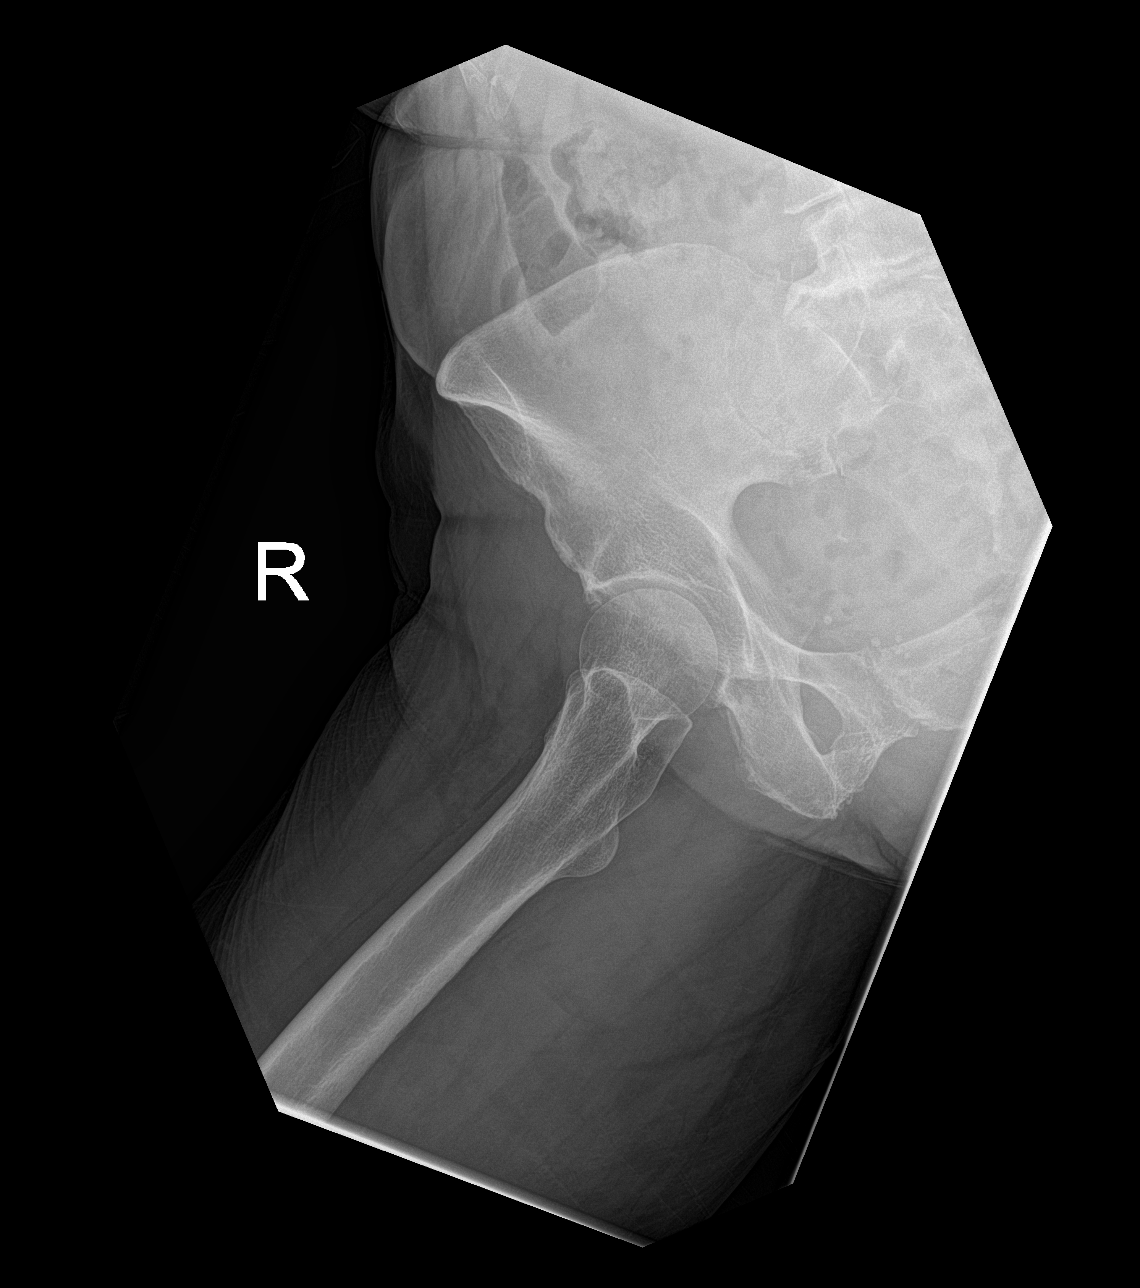

[3 of 3 positions shown; findings below may reference images not displayed]

FINDINGS: No acute fracture or dislocation. Suspected old/healed right
superior and inferior pubic rami fractures. Mild degenerative change
of the right hip joint space loss, subchondral sclerosis and
osteophytosis. No evidence of avascular necrosis.

Limited visualization of pelvis is normal. Mild degenerative change
is suspected within the contralateral left hip and lower lumbar
spine, incompletely evaluated. Phleboliths overlie the lower pelvis
bilaterally regional soft tissues appear otherwise.
IMPRESSION: 1. No acute findings.
2. Mild degenerative change of the right hip.
3. Suspected old/healed right superior and inferior pubic rami
fractures.

## 2022-11-21 ENCOUNTER — Ambulatory Visit: Payer: Federal, State, Local not specified - PPO | Admitting: Family Medicine

## 2022-11-27 ENCOUNTER — Encounter: Payer: Self-pay | Admitting: Family Medicine

## 2022-11-27 ENCOUNTER — Ambulatory Visit: Payer: Federal, State, Local not specified - PPO | Admitting: Family Medicine

## 2022-11-27 VITALS — BP 137/72 | HR 90 | Ht 62.0 in | Wt 133.0 lb

## 2022-11-27 DIAGNOSIS — E039 Hypothyroidism, unspecified: Secondary | ICD-10-CM

## 2022-11-27 DIAGNOSIS — Z96651 Presence of right artificial knee joint: Secondary | ICD-10-CM | POA: Diagnosis not present

## 2022-11-27 DIAGNOSIS — M1712 Unilateral primary osteoarthritis, left knee: Secondary | ICD-10-CM | POA: Diagnosis not present

## 2022-11-27 MED ORDER — TRAMADOL HCL 50 MG PO TABS: ORAL_TABLET | ORAL | 1 refills | Status: DC

## 2022-11-27 NOTE — Assessment & Plan Note (Signed)
Levothyroxine updated after last visit.  Recheck TSH.

## 2022-11-27 NOTE — Progress Notes (Signed)
Cheryl Rogers - 77 y.o. female MRN [id number]  Date of birth: 1945-12-20  Subjective Chief Complaint  Patient presents with   Medication Refill    HPI Cheryl Rogers is a 77 y.o. female here today for follow-up visit.  She reports she is doing pretty well.  She feels like she is doing well with current strength of levothyroxine.  This is increased after her last appointment.  She has not had her TSH rechecked yet.  Using tramadol on occasion for severe knee pain.  She plans to move back to [place] and currently has her house on the market.  When she returns back to [place] she plans to go through with knee replacement surgery.  Anti-inflammatories and Tylenol have not been effective on days that her pain is more severe.  ROS:  A comprehensive ROS was completed and negative except as noted per HPI  Allergies  Allergen Reactions   Iodine Rash    Past Medical History:  Diagnosis Date   Arthritis    Asthma    Thyroid disease     Past Surgical History:  Procedure Laterality Date   APPENDECTOMY     REPLACEMENT TOTAL KNEE Right     Social History   Socioeconomic History   Marital status: Divorced    Spouse name: Not on file   Number of children: 1   Years of education: Not on file   Highest education level: Not on file  Occupational History   Occupation: Retired  Tobacco Use   Smoking status: Never   Smokeless tobacco: Never  Vaping Use   Vaping status: Never Used  Substance and Sexual Activity   Alcohol use: Not Currently   Drug use: Never   Sexual activity: Not Currently    Partners: Male  Other Topics Concern   Not on file  Social History Narrative   Not on file   Social Determinants of Health   Financial Resource Strain: Patient Declined (07/19/2022)   Overall Financial Resource Strain (CARDIA)    Difficulty of Paying Living Expenses: Patient declined  Food Insecurity: Patient Declined (07/19/2022)   Hunger Vital Sign    Worried About Running Out  of Food in the Last Year: Patient declined    Ran Out of Food in the Last Year: Patient declined  Transportation Needs: No Transportation Needs (07/19/2022)   PRAPARE - Administrator, Civil Service (Medical): No    Lack of Transportation (Non-Medical): No  Physical Activity: Sufficiently Active (07/19/2022)   Exercise Vital Sign    Days of Exercise per Week: 5 days    Minutes of Exercise per Session: 120 min  Stress: Stress Concern Present (07/19/2022)   [employer] of Occupational Health - Occupational Stress Questionnaire    Feeling of Stress : To some extent  Social Connections: Unknown (07/19/2022)   Social Connection and Isolation Panel [NHANES]    Frequency of Communication with Friends and Family: Patient declined    Frequency of Social Gatherings with Friends and Family: Patient declined    Attends Religious Services: Patient declined    Database administrator or Organizations: Patient declined    Attends Engineer, structural: Not on file    Marital Status: Divorced    Family History  Problem Relation Age of Onset   Healthy Mother    Healthy Father     Health Maintenance  Topic Date Due   Hepatitis C Screening  Never done   DTaP/Tdap/Td (1 - Tdap) Never done  COVID-19 Vaccine (1) 01/05/2023 (Originally 09/20/1950)   Zoster Vaccines- Shingrix (2 of 2) 01/20/2023 (Originally 04/24/2012)   INFLUENZA VACCINE  05/28/2023 (Originally 09/28/2022)   Pneumonia Vaccine 79+ Years old (1 of 1 - PCV) 07/20/2023 (Originally 09/20/2009)   DEXA SCAN  07/20/2023 (Originally 09/20/2009)   HPV VACCINES  Aged Out     ----------------------------------------------------------------------------------------------------------------------------------------------------------------------------------------------------------------- Physical Exam BP 137/72 (BP Location: Left Arm, Patient Position: Sitting, Cuff Size: Normal)   Pulse 90   Ht 5\' 2"  (1.575 m)   Wt 133 lb (60.3  kg)   SpO2 97%   BMI 24.33 kg/m   Physical Exam Constitutional:      Appearance: Normal appearance.  Eyes:     General: No scleral icterus. Cardiovascular:     Rate and Rhythm: Normal rate and regular rhythm.  Pulmonary:     Effort: Pulmonary effort is normal.     Breath sounds: Normal breath sounds.  Musculoskeletal:     Cervical back: Neck supple.  Neurological:     Mental Status: She is alert.  Psychiatric:        Mood and Affect: Mood normal.        Behavior: Behavior normal.     ------------------------------------------------------------------------------------------------------------------------------------------------------------------------------------------------------------------- Assessment and Plan  Hypothyroidism (acquired) Levothyroxine updated after last visit.  Recheck TSH.  Primary osteoarthritis of left knee Celebrex was ineffective.  I did renew her tramadol to use on occasion for pain.    Meds ordered this encounter  Medications   traMADol (ULTRAM) 50 MG tablet    Sig: TAKE 1 TO 2 TABLETS(50 TO 100 MG) BY MOUTH EVERY 8 HOURS AS NEEDED FOR MODERATE PAIN    Dispense:  30 tablet    Refill:  1    No follow-ups on file.    This visit occurred during the SARS-CoV-2 public health emergency.  Safety protocols were in place, including screening questions prior to the visit, additional usage of staff PPE, and extensive cleaning of exam room while observing appropriate contact time as indicated for disinfecting solutions.

## 2022-11-27 NOTE — Assessment & Plan Note (Signed)
Celebrex was ineffective.  I did renew her tramadol to use on occasion for pain.

## 2022-11-28 LAB — TSH: TSH: 1.24 u[IU]/mL (ref 0.450–4.500)

## 2022-12-08 ENCOUNTER — Telehealth: Payer: Self-pay | Admitting: Family Medicine

## 2022-12-08 NOTE — Telephone Encounter (Signed)
Patient last seen on 11-27-22 she cut her grass and now she has congestion, she has been taking Musinex without little or no relief she is asking if you could call her in something or does she need an appointment  [facility] [place]  Phone number  [phone]

## 2022-12-08 NOTE — Telephone Encounter (Signed)
Left message advising of recommendations.  

## 2022-12-31 IMAGING — DX DG KNEE 1-2V*R*
4 series · 4 of 4 positions shown · non-contrast
Comparison: Bilateral knee x-ray 05/25/2020.

CLINICAL DATA: Knee pain.

EXAM:
RIGHT KNEE - 1-2 VIEW

[knee lat]
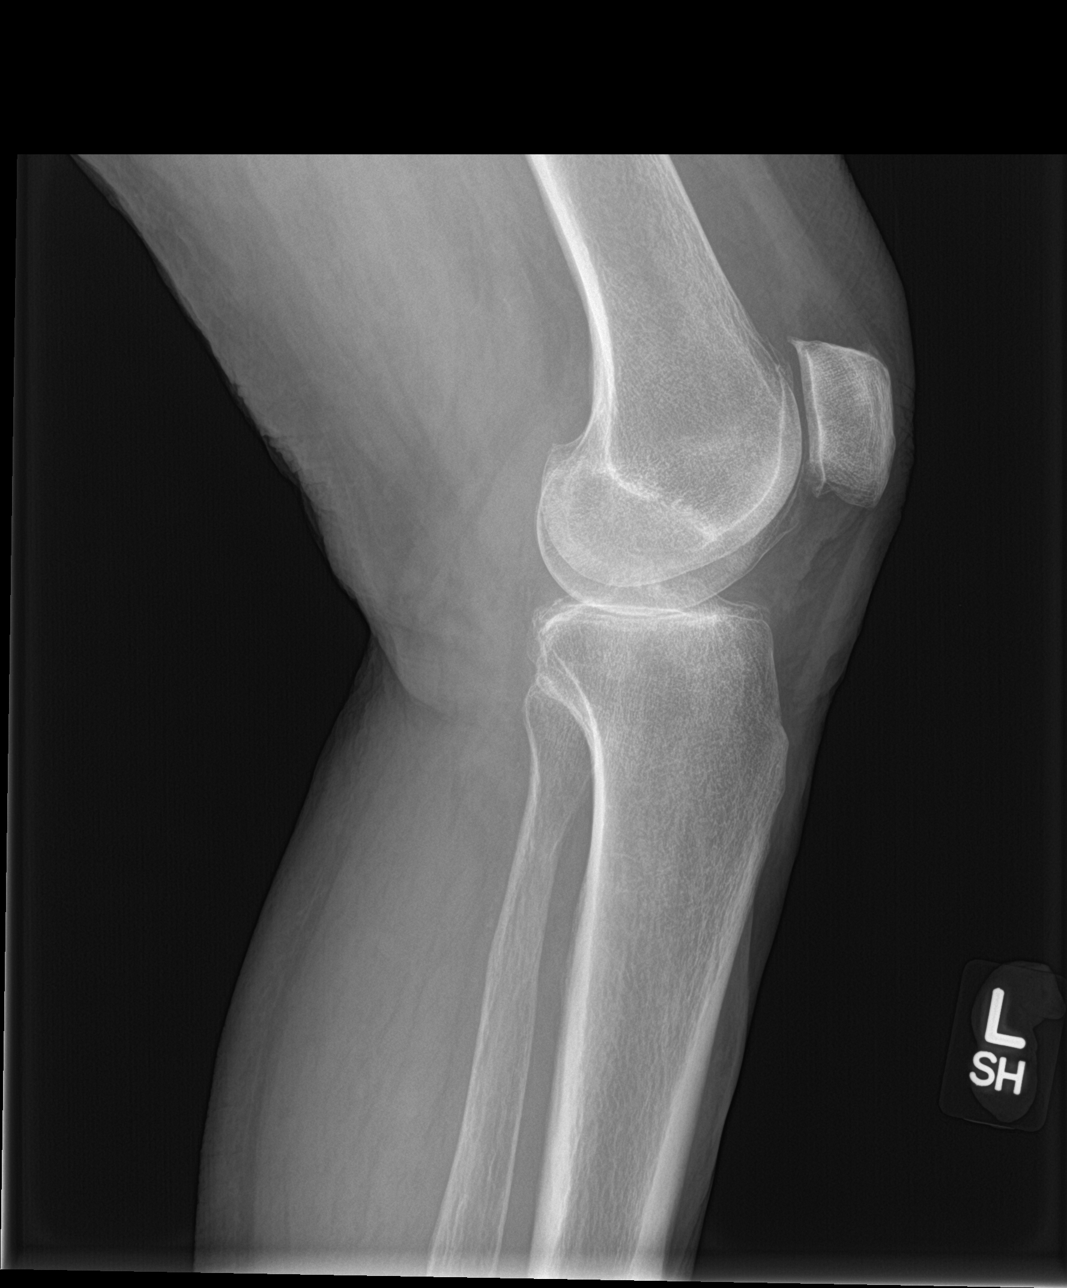

[knee ap bilat standing (1 of 2)]
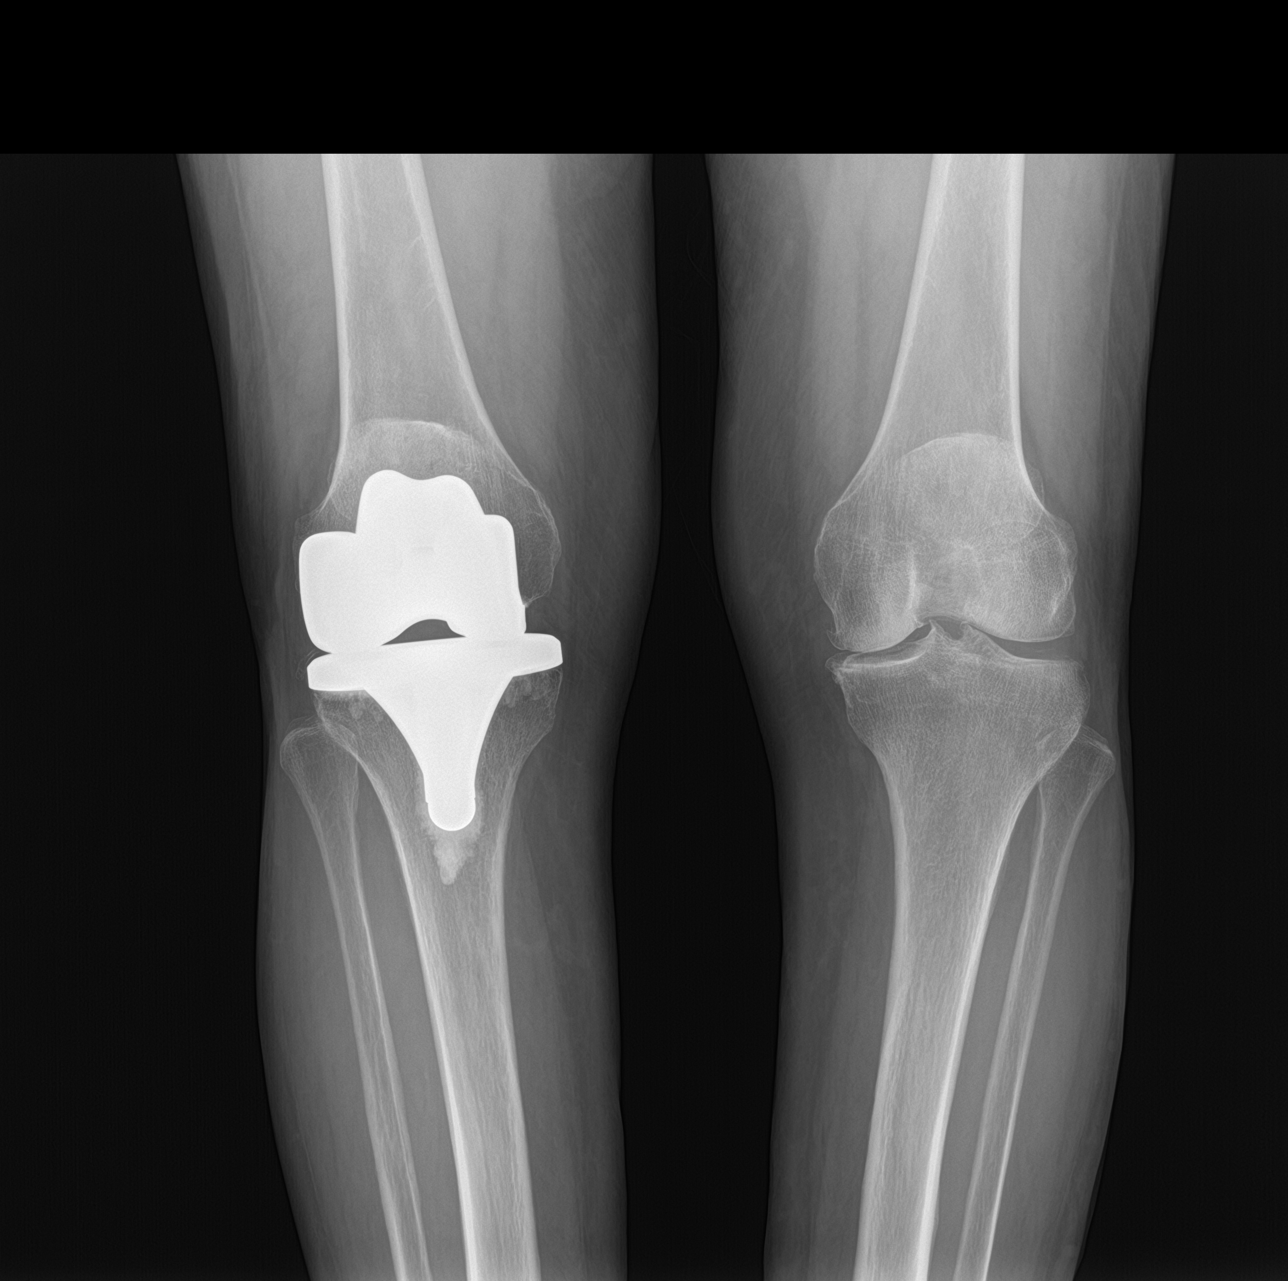

[knee sunrise standing]
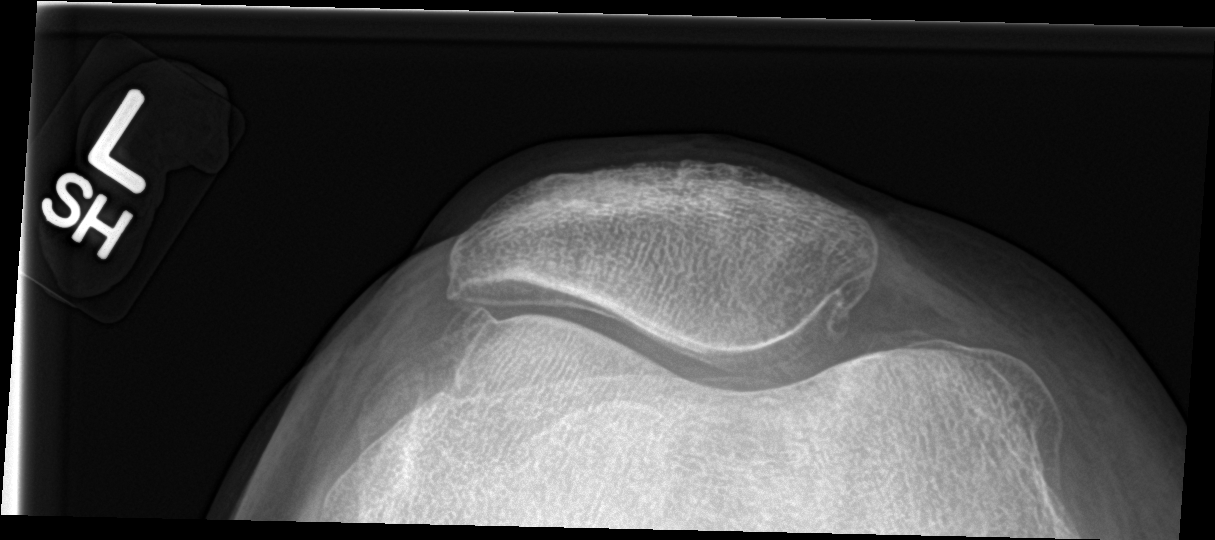

[knee ap bilat standing (2 of 2)]
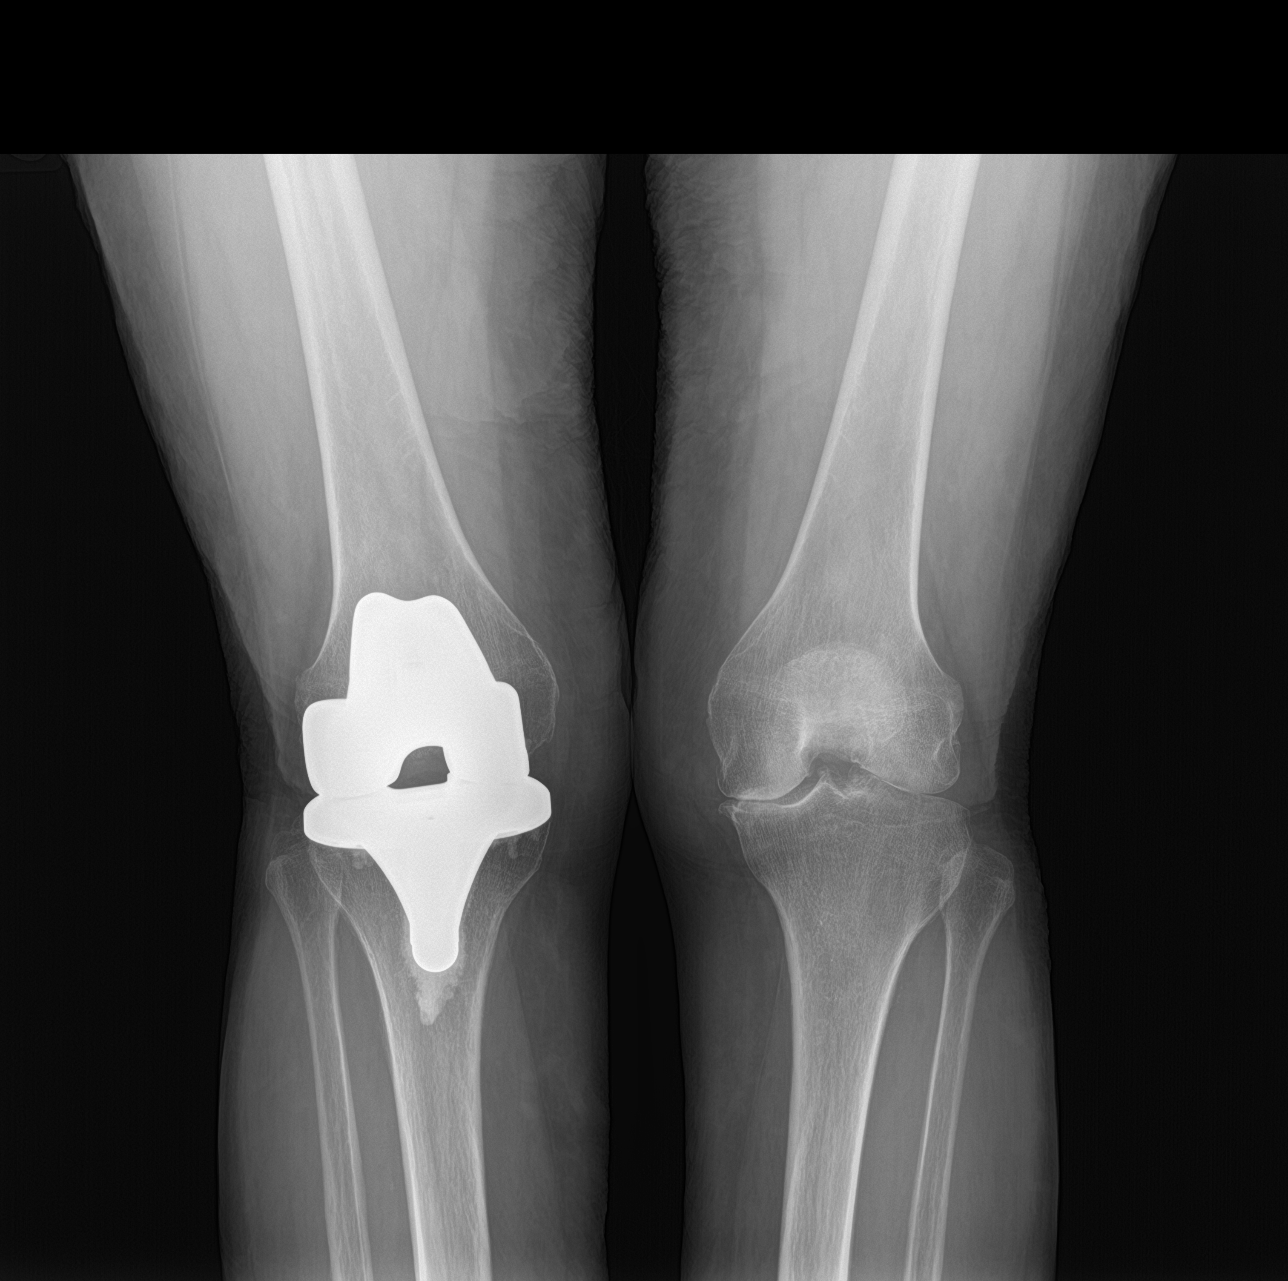

[4 of 4 positions shown; findings below may reference images not displayed]

FINDINGS: There is no acute fracture or dislocation of the left knee. There is
mild medial compartment joint space narrowing. There is medial and
patellofemoral compartment osteophyte formation. There is no
significant joint effusion.

Right knee arthroplasty appears in anatomic alignment.
IMPRESSION: 1. No acute bony abnormality.
2. Mild degenerative changes of the left knee.

## 2023-01-31 ENCOUNTER — Other Ambulatory Visit: Payer: Self-pay | Admitting: Family Medicine

## 2023-02-23 ENCOUNTER — Telehealth: Payer: Self-pay

## 2023-02-23 NOTE — Telephone Encounter (Signed)
Pt requesting Xanax refill

## 2023-02-25 MED ORDER — ALPRAZOLAM 2 MG PO TABS: ORAL_TABLET | ORAL | 1 refills | Status: DC

## 2023-02-25 NOTE — Addendum Note (Signed)
Addended by: Mammie Lorenzo on: 02/25/2023 03:56 PM   Modules accepted: Orders

## 2023-02-27 ENCOUNTER — Other Ambulatory Visit: Payer: Self-pay

## 2023-02-27 DIAGNOSIS — Z96651 Presence of right artificial knee joint: Secondary | ICD-10-CM

## 2023-02-27 DIAGNOSIS — M1712 Unilateral primary osteoarthritis, left knee: Secondary | ICD-10-CM

## 2023-02-27 MED ORDER — TRAMADOL HCL 50 MG PO TABS: ORAL_TABLET | ORAL | 1 refills | Status: DC

## 2023-02-27 NOTE — Telephone Encounter (Signed)
 Copied from CRM [id number]. Topic: Clinical - Medication Refill >> Feb 27, 2023 10:46 AM Franky GRADE wrote: Most Recent Primary Care Visit:  Provider: ALVIA BRING  Department: [facility]  Visit Type: OFFICE VISIT  Date: 11/27/2022  Medication: traMADol  (ULTRAM ) 50 MG tablet  Has the patient contacted their pharmacy? Yes, they don't have a refill available advised patient to call primary care provider.  (Agent: If no, request that the patient contact the pharmacy for the refill. If patient does not wish to contact the pharmacy document the reason why and proceed with request.) (Agent: If yes, when and what did the pharmacy advise?)  Is this the correct pharmacy for this prescription? Yes If no, delete pharmacy and type the correct one.  This is the patient's preferred pharmacy:  [id number] - [place], [place] - [address] AT [facility] OF [address] [place] [address] Phone: [phone] Fax: [phone]   Has the prescription been filled recently? No  Is the patient out of the medication? No, patient only has 5 pills left, she is in the moving process which is causing a strain on her body.   Has the patient been seen for an appointment in the last year OR does the patient have an upcoming appointment? Yes, last appointment was on 11/27/2022  Can we respond through MyChart? Yes  Agent: Please be advised that Rx refills may take up to 3 business days. We ask that you follow-up with your pharmacy.

## 2023-02-28 ENCOUNTER — Other Ambulatory Visit: Payer: Self-pay | Admitting: Family Medicine

## 2023-04-04 ENCOUNTER — Encounter: Payer: Self-pay | Admitting: Family Medicine

## 2023-04-05 ENCOUNTER — Other Ambulatory Visit: Payer: Self-pay | Admitting: Family Medicine

## 2023-04-05 DIAGNOSIS — Z96651 Presence of right artificial knee joint: Secondary | ICD-10-CM

## 2023-04-05 DIAGNOSIS — M1712 Unilateral primary osteoarthritis, left knee: Secondary | ICD-10-CM

## 2023-04-05 MED ORDER — TRAMADOL HCL 50 MG PO TABS: ORAL_TABLET | ORAL | 0 refills | Status: AC

## 2023-04-05 MED ORDER — ALPRAZOLAM 2 MG PO TABS: ORAL_TABLET | ORAL | 0 refills | Status: AC

## 2023-10-30 ENCOUNTER — Encounter: Payer: Self-pay | Admitting: Sports Medicine
